# Patient Record
Sex: Female | Born: 1985 | Race: Black or African American | Hispanic: No | Marital: Single | Smoking: Never smoker
Health system: Southern US, Community
[De-identification: ages and names within clinical notes are randomized; demographics above are authoritative.]

## PROBLEM LIST (undated history)

## (undated) DIAGNOSIS — G5600 Carpal tunnel syndrome, unspecified upper limb: Secondary | ICD-10-CM

## (undated) DIAGNOSIS — R7989 Other specified abnormal findings of blood chemistry: Secondary | ICD-10-CM

## (undated) HISTORY — PX: WISDOM TOOTH EXTRACTION: SHX21

## (undated) HISTORY — DX: Carpal tunnel syndrome, unspecified upper limb: G56.00

## (undated) HISTORY — DX: Other specified abnormal findings of blood chemistry: R79.89

---

## 2007-03-17 ENCOUNTER — Other Ambulatory Visit: Admission: RE | Admit: 2007-03-17 | Discharge: 2007-03-17 | Payer: Self-pay | Admitting: Obstetrics and Gynecology

## 2008-01-19 ENCOUNTER — Inpatient Hospital Stay (HOSPITAL_COMMUNITY): Admission: AD | Admit: 2008-01-19 | Discharge: 2008-01-19 | Payer: Self-pay | Admitting: Obstetrics and Gynecology

## 2008-03-11 ENCOUNTER — Inpatient Hospital Stay (HOSPITAL_COMMUNITY): Admission: AD | Admit: 2008-03-11 | Discharge: 2008-03-13 | Payer: Self-pay | Admitting: Family Medicine

## 2010-10-28 NOTE — H&P (Signed)
NAME:  Yvette Zuniga, CALL                ACCOUNT NO.:  [id number]   MEDICAL RECORD NO.:  [id number]          PATIENT TYPE:  INP   LOCATION:  9174                          FACILITY:  [facility]   PHYSICIAN:  Janine Limbo, M.D.DATE OF BIRTH:  10-28-1985   DATE OF ADMISSION:  03/11/2008  DATE OF DISCHARGE:                              HISTORY & PHYSICAL   Yvette Zuniga is a 25 year old single black female primigravida at 39-3/7  weeks for an [facility] of March 15, 2008, who presents in active labor.  She  reports mucusy brown discharge, no leakage of fluid, good fetal  movement.  She has been contracting irregularly most of the day.  She  denied any other complaints.  She has been followed by the CNM service  at [facility].   HISTORY:  Remarkable for:  1. PENICILLIN ALLERGY.  2. GBS negative.  3. History of migraines.   OBSTETRICAL HISTORY:  The patient is primigravida.   PRENATAL LABS:  Blood type is B positive, Rh antibody screen negative,  sickle cell negative, RPR nonreactive, rubella titer immune, Hepatitis  Surface Antigen negative, HIV nonreactive, cystic fibrosis negative.  Pap was within normal limits October of 2008, and GC and Chlamydia  cultures were negative at that time.  GBS is negative.   PAST MEDICAL HISTORY:  Patient reports an ALLERGY TO PENICILLIN WHICH  CAUSES HIVES.  Denies latex allergy or other sensitivities.  She reports  menarche at age 62, 28-day cycle, no abnormalities.  Reported a last  menstrual period of June 07, 2007, giving her an [facility] of March 15, 2008.  She had an ultrasound February 11th and size was equal to dates  and dating consistent with LMP, that was a 6-week ultrasound.  She  reports use of birth control pills in the past, MRSA and Yasmin.  Her  Pap smear that was in October of 2008 was at [facility].  She reports Hepatitis B vaccine, normal childhood illnesses, a history  of migraines.   SURGICAL HISTORY:  Remarkable for wisdom teeth  excision x4 2008.   GENETIC HISTORY:  Unremarkable.   FAMILY HISTORY:  Mom chronic hypertension.  Maternal grandmother chronic  hypertension, is deceased.  Sister has sickle cell trait.  Maternal aunt  diabetic, insulin-dependent.  Sister hypothyroidism.  Mom has had a  stroke.   SOCIAL HISTORY:  Patient is a single white female.  She reports [employer].  Father of the baby's name is Janit Pagan.  Patient has  bachelor's degree, full-time Teacher, English as a foreign language.  Father of the baby  received his GED, he is unemployed.  Denied alcohol, tobacco or illicit  drug use.  The father of the baby has been here today and seems involved  and supportive.   HISTORY OF PRESENT PREGNANCY:  Patient entered care February 23rd for  new OB interview.  She is approximately 8-6/[redacted] weeks gestation.  Her  height was 5 feet 3 inches, her pregravid weight was 173.  When she  returned for her new OB workup on March 5th her weight was 184.  Desired  CNM  care.  She did have a first trimester screen on March 23rd and was  within normal limits.  She had an anatomy ultrasound at 18-5/7 weeks,  single intrauterine pregnancy, size equal to dates, cervical length 3.2  cm, posterior placenta, anatomy was within normal limits.  She was  slightly disappointed that suggestive of female.  She had lost some  weight prior to that visit and was back up to the 184.  Father of baby  was present at that visit.  At 23 weeks,  patient was doing well without  complaints.  At approximately 27 weeks, patient had 1+ glucosuria,  Dextrostix was 141, she was scheduled for a 1-hour GTT that day but  deferred.  Hemoglobin A1c was drawn at that time and was 4.8.  She  returned at 27-4/7 weeks and had her Glucola, hemoglobin at that time  was 10.6.  One-hour GTT was within normal limits equal to 124.  She was  put on iron supplement at previous visit and was having some trouble  with that and she desired to continue with some dietary  management.  At  her 32-week visit, she felt faint, she had to sit on the floor,  Dextrostix was 98, she was a little diaphoretic, blood pressure was  slightly low 90s/40s, 88/58 standing, lying down was 74/44.  MD was  consulted and was recommended a neuro eval.  That is the end of her  [place] that is available at this time.  She denied any other  significant complications in the pregnancy leading up to her admission  today in active labor.   OBJECTIVE:  She was afebrile and vital signs stable on admission, fetal  heart rate 130s, moderate variability, intermittent mild variables with  some of her contractions, it was reactive.  Toco she had a couplet  pattern every 2-4 minutes, moderate contractions on palpation.   PHYSICAL EXAM:  GENERAL:  She had some labored breathing but very  controlled, she was alert and oriented x3, very pleasant in between her  contractions.  CARDIOVASCULAR:  Regular rate and rhythm without murmur.  LUNGS:  Clear to auscultation bilaterally.  ABDOMEN:  Soft, nontender and gravid.  CERVIX:  Was 5 cm, 90%, -2 with bulging membranes, it was soft.  EXTREMITIES:  She had 1+ pitting edema both lower extremities, upper  shins and pedal as well.   IMPRESSION:  1. Intrauterine pregnancy at 39-3/7 weeks.  2. Active labor.  3. Group beta streptococcus negative.   PLAN:  1. Admit to birthing suites with Dr. Stefano Gaul as attending physician.  2. Routine L and D orders.  3. Support p.r.n.  4. Consult MD p.r.n.  5. AROM p.r.n. augmentation.      [name], CNM      ______________________________  Janine Limbo, M.D.    CHS/MEDQ  D:  03/11/2008  T:  03/11/2008  Job:  [id number]

## 2011-03-13 LAB — CBC
HCT: 28.6 — ABNORMAL LOW
Hemoglobin: 9.7 — ABNORMAL LOW
MCV: 94.3
Platelets: 330
RDW: 13

## 2011-03-13 LAB — COMPREHENSIVE METABOLIC PANEL
Alkaline Phosphatase: 81
BUN: 4 — ABNORMAL LOW
Chloride: 104
Creatinine, Ser: 0.54
Glucose, Bld: 97
Potassium: 3.6
Total Bilirubin: 0.5
Total Protein: 5.4 — ABNORMAL LOW

## 2011-03-16 LAB — CBC
HCT: 31.2 — ABNORMAL LOW
Hemoglobin: 10.6 — ABNORMAL LOW
Hemoglobin: 12
MCV: 94.8
RBC: 3.83 — ABNORMAL LOW
WBC: 15.4 — ABNORMAL HIGH
WBC: 16.4 — ABNORMAL HIGH

## 2011-03-16 LAB — RPR: RPR Ser Ql: NONREACTIVE

## 2011-08-20 ENCOUNTER — Encounter (INDEPENDENT_AMBULATORY_CARE_PROVIDER_SITE_OTHER): Payer: 59 | Admitting: Obstetrics and Gynecology

## 2011-08-20 DIAGNOSIS — N870 Mild cervical dysplasia: Secondary | ICD-10-CM

## 2012-06-28 ENCOUNTER — Other Ambulatory Visit: Payer: Self-pay

## 2012-06-28 MED ORDER — ETONOGESTREL-ETHINYL ESTRADIOL 0.12-0.015 MG/24HR VA RING: VAGINAL_RING | VAGINAL | Status: DC

## 2012-06-28 NOTE — Telephone Encounter (Signed)
Pt left msg on rx refill line requesting refill for Nuvaring. Last aex was 07/07/11, sent 1 nuvaring to [facility] [address] per pt request with 0 refills. Aex scheduled for 07/18/12 with AR.

## 2012-07-18 ENCOUNTER — Encounter: Payer: Self-pay | Admitting: Obstetrics and Gynecology

## 2012-07-18 ENCOUNTER — Ambulatory Visit: Payer: 59 | Admitting: Obstetrics and Gynecology

## 2012-07-18 VITALS — BP 102/60 | Resp 16 | Ht 64.25 in | Wt 200.0 lb

## 2012-07-18 DIAGNOSIS — IMO0001 Reserved for inherently not codable concepts without codable children: Secondary | ICD-10-CM

## 2012-07-18 DIAGNOSIS — Z01419 Encounter for gynecological examination (general) (routine) without abnormal findings: Secondary | ICD-10-CM

## 2012-07-18 DIAGNOSIS — Z124 Encounter for screening for malignant neoplasm of cervix: Secondary | ICD-10-CM

## 2012-07-18 MED ORDER — ETONOGESTREL-ETHINYL ESTRADIOL 0.12-0.015 MG/24HR VA RING: VAGINAL_RING | VAGINAL | Status: DC

## 2012-07-18 NOTE — Addendum Note (Signed)
Addended by: Osborn Coho on: 07/18/2012 11:36 AM   Modules accepted: Orders

## 2012-07-18 NOTE — Progress Notes (Signed)
Patient ID: Estefania Kamiya, female   DOB: [birth year], 27 y.o.   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 Last Mammo never Last Colonoscopy never Last Dexa Scan never Primary MD none Abuse at Home no  No complaints and wants to cont Nuvaring  Filed Vitals:   07/18/12 1048  BP: 102/60  Resp: 16   ROS: noncontributory  Physical Examination: General appearance - alert, well appearing, and in no distress Neck - supple, no significant adenopathy Chest - clear to auscultation, no wheezes, rales or rhonchi, symmetric air entry Heart - normal rate and regular rhythm Abdomen - soft, nontender, nondistended, no masses or organomegaly Breasts - breasts appear normal, no suspicious masses, no skin or nipple changes or axillary nodes Pelvic - normal external genitalia, vulva, vagina, cervix, uterus and adnexa Back exam - no CVAT Extremities - no edema, redness or tenderness in the calves or thighs  A/P Pap today 65yr for AEX Samples Nuvaring and Rx sent

## 2012-07-19 LAB — PAP IG W/ RFLX HPV ASCU

## 2012-07-20 LAB — HUMAN PAPILLOMAVIRUS, HIGH RISK: HPV DNA High Risk: NOT DETECTED

## 2012-07-22 ENCOUNTER — Encounter: Payer: Self-pay | Admitting: Obstetrics and Gynecology

## 2014-08-29 ENCOUNTER — Encounter: Payer: Self-pay | Admitting: *Deleted

## 2014-08-29 ENCOUNTER — Telehealth: Payer: Self-pay | Admitting: *Deleted

## 2014-08-29 NOTE — Telephone Encounter (Signed)
Pre-Visit Call completed with patient and chart updated.   Pre-Visit Info documented in Specialty Comments under Snap[employer]ot.    

## 2014-08-30 ENCOUNTER — Encounter: Payer: Self-pay | Admitting: Medical

## 2014-08-30 ENCOUNTER — Ambulatory Visit (INDEPENDENT_AMBULATORY_CARE_PROVIDER_SITE_OTHER): Payer: 59 | Admitting: Medical

## 2014-08-30 VITALS — BP 113/76 | HR 91 | Temp 98.4°F | Ht 64.25 in | Wt 215.8 lb

## 2014-08-30 DIAGNOSIS — D75839 Thrombocytosis, unspecified: Secondary | ICD-10-CM | POA: Insufficient documentation

## 2014-08-30 DIAGNOSIS — D473 Essential (hemorrhagic) thrombocythemia: Secondary | ICD-10-CM | POA: Diagnosis not present

## 2014-08-30 DIAGNOSIS — R7989 Other specified abnormal findings of blood chemistry: Secondary | ICD-10-CM

## 2014-08-30 DIAGNOSIS — G5600 Carpal tunnel syndrome, unspecified upper limb: Secondary | ICD-10-CM | POA: Insufficient documentation

## 2014-08-30 HISTORY — DX: Carpal tunnel syndrome, unspecified upper limb: G56.00

## 2014-08-30 LAB — COMPREHENSIVE METABOLIC PANEL
ALBUMIN: 3.6 g/dL (ref 3.5–5.2)
ALT: 8 U/L (ref 0–35)
AST: 14 U/L (ref 0–37)
Alkaline Phosphatase: 55 U/L (ref 39–117)
BUN: 9 mg/dL (ref 6–23)
CO2: 25 meq/L (ref 19–32)
CREATININE: 0.9 mg/dL (ref 0.40–1.20)
Calcium: 8.8 mg/dL (ref 8.4–10.5)
Chloride: 105 mEq/L (ref 96–112)
GFR: 94.66 mL/min (ref >60.00)
Glucose, Bld: 79 mg/dL (ref 70–99)
Potassium: 3.8 mEq/L (ref 3.5–5.1)
Sodium: 136 mEq/L (ref 135–145)
TOTAL PROTEIN: 7.3 g/dL (ref 6.0–8.3)
Total Bilirubin: 0.4 mg/dL (ref 0.2–1.2)

## 2014-08-30 LAB — CBC WITH DIFFERENTIAL/PLATELET
BASOS ABS: 0 10*3/uL (ref 0.0–0.1)
Basophils Relative: 0.5 % (ref 0.0–3.0)
EOS PCT: 0.3 % (ref 0.0–5.0)
Eosinophils Absolute: 0 10*3/uL (ref 0.0–0.7)
HCT: 38 % (ref 36.0–46.0)
Hemoglobin: 13.1 g/dL (ref 12.0–15.0)
LYMPHS ABS: 2.6 10*3/uL (ref 0.7–4.0)
LYMPHS PCT: 25.2 % (ref 12.0–46.0)
MCHC: 34.4 g/dL (ref 30.0–36.0)
MCV: 89 fl (ref 78.0–100.0)
MONO ABS: 0.4 10*3/uL (ref 0.1–1.0)
Monocytes Relative: 4.4 % (ref 3.0–12.0)
NEUTROS ABS: 7.1 10*3/uL (ref 1.4–7.7)
Neutrophils Relative %: 69.6 % (ref 43.0–77.0)
Platelets: 394 10*3/uL (ref 150.0–400.0)
RBC: 4.27 Mil/uL (ref 3.87–5.11)
RDW: 12.8 % (ref 11.5–15.5)
WBC: 10.1 10*3/uL (ref 4.0–10.5)

## 2014-08-30 NOTE — Assessment & Plan Note (Signed)
Will get cbc today and recheck platlets then decide if possible referral needed.

## 2014-08-30 NOTE — Assessment & Plan Note (Addendum)
Pt on vitamin D. Will put future order to repeat after she has finished 50,000 unit supplemenation and started otc vit d.  Lab to be done in 2 months.

## 2014-08-30 NOTE — Patient Instructions (Signed)
Thrombocytosis Will get cbc today and recheck platlets then decide if possible referral needed.   Low serum vitamin D Pt on vitamin D. Will put future order to repeat after she has finished 50,000 unit supplemenation and started otc vit d.  Lab to be done in 2 months.    Follow up date to be determined post lab review. We will call you when results are in.

## 2014-08-30 NOTE — Progress Notes (Signed)
Pre visit review using our clinic review tool, if applicable. No additional management support is needed unless otherwise documented below in the visit note. 

## 2014-08-30 NOTE — Assessment & Plan Note (Signed)
Did not discuss with pt today but in future will recommend cock up splints and low dose ibuprofen. Will mention on follow up.

## 2014-08-30 NOTE — Progress Notes (Signed)
Subjective:    Patient ID: Yvette Zuniga, female    DOB: [birth year], 29 y.o.   MRN: [id number]  HPI   I have reviewed pt PMH, PSH, FH, Social History and Surgical History  No chronic medical problems in the past. Only mild increase platelets and low vitamin d. No surgeries in past other than wisdome teeth extraction.  Pt is Personnel officer, exercise rarely recently due to work, Caffeine- 1 soda a week, single. 1 child 72 yo  Pt has mild elevated platelets in past one time 2014 and another in 2015. Plt 2014 was 427. Plt 2016 feb was 438.  Pt ros was negtaive except at night fingers tingle at night sometimes. She types a lot at work.  LMP- January 20 th, 2016. Pt is on nuvaring. Hx of skipped cycles with this in the past. On this for 6 years.  No family history of clotting issues.       Review of Systems  Constitutional: Negative for fever, chills and fatigue.  Respiratory: Negative for cough, chest tightness, shortness of breath and wheezing.   Cardiovascular: Negative for chest pain and palpitations.  Gastrointestinal: Negative for nausea, abdominal pain, diarrhea, constipation, blood in stool and anal bleeding.  Musculoskeletal: Negative for back pain.  Neurological: Positive for headaches. Negative for dizziness and weakness.       Very rare faint ha. Resolves with motrin.  Occasional tingle to finger tips at night. Transient and no other associated symptoms.  Hematological: Negative for adenopathy. Does not bruise/bleed easily.  Psychiatric/Behavioral: Negative for behavioral problems, confusion, self-injury, dysphoric mood and decreased concentration.   Past Medical History  Diagnosis Date  . High platelet count     History   Social History  . Marital Status: Single    Spouse Name: N/A  . Number of Children: N/A  . Years of Education: N/A   Occupational History  . Not on file.   Social History Main Topics  . Smoking status: Never Smoker   . Smokeless  tobacco: Not on file  . Alcohol Use: Yes     Comment: 1 bottle of wine twice a month.  . Drug Use: No  . Sexual Activity: Yes   Other Topics Concern  . Not on file   Social History Narrative    Past Surgical History  Procedure Laterality Date  . Wisdom tooth extraction      Family History  Problem Relation Age of Onset  . Hypertension Mother   . Diabetes Mother   . Stroke Mother   . Glaucoma Father     Allergies  Allergen Reactions  . Penicillins     Current Outpatient Prescriptions on File Prior to Visit  Medication Sig Dispense Refill  . etonogestrel-ethinyl estradiol (NUVARING) 0.12-0.015 MG/24HR vaginal ring Insert vaginally and leave in place for 3 consecutive weeks, then remove for 1 week. 1 each 12  . Vitamin D, Ergocalciferol, (DRISDOL) 50000 UNITS CAPS capsule Take 50,000 Units by mouth every 7 (seven) days.     No current facility-administered medications on file prior to visit.    BP 113/76 mmHg  Pulse 91  Temp(Src) 98.4 F (36.9 C) (Oral)  Ht 5' 4.25" (1.632 m)  Wt 215 lb 12.8 oz (97.886 kg)  BMI 36.75 kg/m2  SpO2 100%  LMP 07/04/2014      Objective:   Physical Exam   General Mental Status- Alert. General Appearance- Not in acute distress.   Skin General: Color- Normal Color. Moisture- Normal Moisture.  Neck  Carotid Arteries- Normal color. Moisture- Normal Moisture. No carotid bruits. No JVD.  Chest and Lung Exam Auscultation: Breath Sounds:-Normal.  Cardiovascular Auscultation:Rythm- Regular. Murmurs & Other Heart Sounds:Auscultation of the heart reveals- No Murmurs.  Abdomen Inspection:-Inspeection Normal. Palpation/Percussion:Note:No mass. Palpation and Percussion of the abdomen reveal- Non Tender, Non Distended + BS, no rebound or guarding.    Neurologic Cranial Nerve exam:- CN III-XII intact(No nystagmus), symmetric smile. Drift Test:- No drift. Romberg Exam:- Negative.  Heal to Toe Gait exam:-Normal. Finger to Nose:-  Normal/Intact Strength:- 5/5 equal and symmetric strength both upper and lower extremities.  Upper ext-  Negative phalens signs bilaterally.     Assessment & Plan:

## 2014-12-18 ENCOUNTER — Telehealth: Payer: Self-pay | Admitting: *Deleted

## 2014-12-18 NOTE — Telephone Encounter (Signed)
Unable to reach patient at time of Pre-Visit Call.  Left message for patient to return call when available.    

## 2014-12-19 ENCOUNTER — Ambulatory Visit (INDEPENDENT_AMBULATORY_CARE_PROVIDER_SITE_OTHER): Payer: 59 | Admitting: Medical

## 2014-12-19 ENCOUNTER — Encounter: Payer: Self-pay | Admitting: Medical

## 2014-12-19 VITALS — BP 116/83 | HR 100 | Temp 98.5°F | Ht 64.25 in | Wt 229.2 lb

## 2014-12-19 DIAGNOSIS — N390 Urinary tract infection, site not specified: Secondary | ICD-10-CM

## 2014-12-19 DIAGNOSIS — Z0000 Encounter for general adult medical examination without abnormal findings: Secondary | ICD-10-CM | POA: Diagnosis not present

## 2014-12-19 DIAGNOSIS — R82998 Other abnormal findings in urine: Secondary | ICD-10-CM

## 2014-12-19 LAB — POCT URINALYSIS DIPSTICK
BILIRUBIN UA: NEGATIVE
Blood, UA: POSITIVE
Glucose, UA: NEGATIVE
KETONES UA: NEGATIVE
NITRITE UA: NEGATIVE
PH UA: 5
PROTEIN UA: NEGATIVE
Spec Grav, UA: 1.025
Urobilinogen, UA: 0.2

## 2014-12-19 LAB — LIPID PANEL
Cholesterol: 150 mg/dL (ref 0–200)
HDL: 55.3 mg/dL (ref >39.00)
LDL Cholesterol: 73 mg/dL (ref 0–99)
NONHDL: 94.7
Total CHOL/HDL Ratio: 3
Triglycerides: 107 mg/dL (ref 0.0–149.0)
VLDL: 21.4 mg/dL (ref 0.0–40.0)

## 2014-12-19 LAB — COMPREHENSIVE METABOLIC PANEL
ALBUMIN: 3.5 g/dL (ref 3.5–5.2)
ALT: 15 U/L (ref 0–35)
AST: 15 U/L (ref 0–37)
Alkaline Phosphatase: 58 U/L (ref 39–117)
BUN: 12 mg/dL (ref 6–23)
CALCIUM: 9.1 mg/dL (ref 8.4–10.5)
CHLORIDE: 105 meq/L (ref 96–112)
CO2: 22 meq/L (ref 19–32)
CREATININE: 0.81 mg/dL (ref 0.40–1.20)
GFR: 106.68 mL/min (ref >60.00)
Glucose, Bld: 75 mg/dL (ref 70–99)
POTASSIUM: 3.6 meq/L (ref 3.5–5.1)
Sodium: 137 mEq/L (ref 135–145)
Total Bilirubin: 0.4 mg/dL (ref 0.2–1.2)
Total Protein: 7.7 g/dL (ref 6.0–8.3)

## 2014-12-19 LAB — TSH: TSH: 2.56 u[IU]/mL (ref 0.35–4.50)

## 2014-12-19 NOTE — Progress Notes (Signed)
Subjective:    Patient ID: Yvette Zuniga, female    DOB: Oct 02, 1985, 29 y.o.   MRN: [id number]  HPI   Pt in for a physical/wellness exam.  Pt has not been exercising, Caffeine 1 soda week, Diet eats some fruits and vegetables. Protein is chicken. Not a lot of red meat. Single-1 child.  Pt up to date on her tdap. Due in 2018.  Pt reminded to get fluvaccine this year towards the fall.  Pt recently had pap smear with gyn and breast exam. Both negative in February.  Pt taking a vacation this summer(September)    Review of Systems  Constitutional: Positive for fatigue. Negative for fever, chills, diaphoresis and activity change.       Fatigue worse recently than in the spring.  Respiratory: Negative for cough, chest tightness and shortness of breath.   Cardiovascular: Negative for chest pain, palpitations and leg swelling.  Gastrointestinal: Negative for nausea, vomiting and abdominal pain.  Musculoskeletal: Negative for neck pain and neck stiffness.  Neurological: Negative for dizziness, tremors, seizures, syncope, facial asymmetry, speech difficulty, weakness, light-headedness, numbness and headaches.  Hematological: Negative for adenopathy. Does not bruise/bleed easily.  Psychiatric/Behavioral: Negative for behavioral problems, confusion and agitation. The patient is not nervous/anxious.     Past Medical History  Diagnosis Date  . High platelet count   . CTS (carpal tunnel syndrome) 08/30/2014    History   Social History  . Marital Status: Single    Spouse Name: N/A  . Number of Children: N/A  . Years of Education: N/A   Occupational History  . Not on file.   Social History Main Topics  . Smoking status: Never Smoker   . Smokeless tobacco: Not on file  . Alcohol Use: Yes     Comment: 1 bottle of wine twice a month.  . Drug Use: No  . Sexual Activity: Yes   Other Topics Concern  . Not on file   Social History Narrative    Past Surgical History  Procedure  Laterality Date  . Wisdom tooth extraction      Family History  Problem Relation Age of Onset  . Hypertension Mother   . Diabetes Mother   . Stroke Mother   . Glaucoma Father     Allergies  Allergen Reactions  . Penicillins     Current Outpatient Prescriptions on File Prior to Visit  Medication Sig Dispense Refill  . etonogestrel-ethinyl estradiol (NUVARING) 0.12-0.015 MG/24HR vaginal ring Insert vaginally and leave in place for 3 consecutive weeks, then remove for 1 week. 1 each 12  . Vitamin D, Ergocalciferol, (DRISDOL) 50000 UNITS CAPS capsule Take 50,000 Units by mouth every 7 (seven) days.     No current facility-administered medications on file prior to visit.    BP 116/83 mmHg  Pulse 100  Temp(Src) 98.5 F (36.9 C) (Oral)  Ht 5' 4.25" (1.632 m)  Wt 229 lb 3.2 oz (103.964 kg)  BMI 39.03 kg/m2  SpO2 96%  LMP 12/10/2014       Objective:   Physical Exam  General Mental Status- Alert. General Appearance- Not in acute distress.   Skin General: Color- Normal Color. Moisture- Normal Moisture.  Neck Carotid Arteries- Normal color. Moisture- Normal Moisture. No carotid bruits. No JVD.  Chest and Lung Exam Auscultation: Breath Sounds:-Normal. CTA.  Cardiovascular Auscultation:Rythm- Regular, Rate and Rythm Murmurs & Other Heart Sounds:Auscultation of the heart reveals- No Murmurs.  Abdomen Inspection:-Inspeection Normal. Palpation/Percussion:Note:No mass. Palpation and Percussion of the abdomen reveal-  Non Tender, Non Distended + BS, no rebound or guarding.    Neurologic Cranial Nerve exam:- CN III-XII intact(No nystagmus), symmetric smile. Strength:- 5/5 equal and symmetric strength both upper and lower extremities.      Assessment & Plan:

## 2014-12-19 NOTE — Addendum Note (Signed)
Addended by: Bunnie Domino on: 12/19/2014 10:16 AM   Modules accepted: Orders

## 2014-12-19 NOTE — Progress Notes (Signed)
Pre visit review using our clinic review tool, if applicable. No additional management support is needed unless otherwise documented below in the visit note. 

## 2014-12-19 NOTE — Assessment & Plan Note (Signed)
Get cbc, cmp, tsh, lipid panel and ua today. Pt up to date on tetanus. Will get fluvaccine in early fall.(pt advised).

## 2014-12-19 NOTE — Patient Instructions (Addendum)
Wellness examination Get cbc, cmp, tsh, lipid panel and ua today. Pt up to date on tetanus. Will get fluvaccine in early fall.(pt advised).      Recommend daily exercise and healthy diet. Labs do work up fatigue.  Follow up year for wellness. But for fatigue date to be determined after lab review.   Preventive Care for Adults A healthy lifestyle and preventive care can promote health and wellness. Preventive health guidelines for women include the following key practices.  A routine yearly physical is a good way to check with your health care provider about your health and preventive screening. It is a chance to share any concerns and updates on your health and to receive a thorough exam.  Visit your dentist for a routine exam and preventive care every 6 months. Brush your teeth twice a day and floss once a day. Good oral hygiene prevents tooth decay and gum disease.  The frequency of eye exams is based on your age, health, family medical history, use of contact lenses, and other factors. Follow your health care provider's recommendations for frequency of eye exams.  Eat a healthy diet. Foods like vegetables, fruits, whole grains, low-fat dairy products, and lean protein foods contain the nutrients you need without too many calories. Decrease your intake of foods high in solid fats, added sugars, and salt. Eat the right amount of calories for you.Get information about a proper diet from your health care provider, if necessary.  Regular physical exercise is one of the most important things you can do for your health. Most adults should get at least 150 minutes of moderate-intensity exercise (any activity that increases your heart rate and causes you to sweat) each week. In addition, most adults need muscle-strengthening exercises on 2 or more days a week.  Maintain a healthy weight. The body mass index (BMI) is a screening tool to identify possible weight problems. It provides an estimate of  body fat based on height and weight. Your health care provider can find your BMI and can help you achieve or maintain a healthy weight.For adults 20 years and older:  A BMI below 18.5 is considered underweight.  A BMI of 18.5 to 24.9 is normal.  A BMI of 25 to 29.9 is considered overweight.  A BMI of 30 and above is considered obese.  Maintain normal blood lipids and cholesterol levels by exercising and minimizing your intake of saturated fat. Eat a balanced diet with plenty of fruit and vegetables. Blood tests for lipids and cholesterol should begin at age 13 and be repeated every 5 years. If your lipid or cholesterol levels are high, you are over 50, or you are at high risk for heart disease, you may need your cholesterol levels checked more frequently.Ongoing high lipid and cholesterol levels should be treated with medicines if diet and exercise are not working.  If you smoke, find out from your health care provider how to quit. If you do not use tobacco, do not start.  Lung cancer screening is recommended for adults aged 10-80 years who are at high risk for developing lung cancer because of a history of smoking. A yearly low-dose CT scan of the lungs is recommended for people who have at least a 30-pack-year history of smoking and are a current smoker or have quit within the past 15 years. A pack year of smoking is smoking an average of 1 pack of cigarettes a day for 1 year (for example: 1 pack a day for 30  years or 2 packs a day for 15 years). Yearly screening should continue until the smoker has stopped smoking for at least 15 years. Yearly screening should be stopped for people who develop a health problem that would prevent them from having lung cancer treatment.  If you are pregnant, do not drink alcohol. If you are breastfeeding, be very cautious about drinking alcohol. If you are not pregnant and choose to drink alcohol, do not have more than 1 drink per day. One drink is considered to  be 12 ounces (355 mL) of beer, 5 ounces (148 mL) of wine, or 1.5 ounces (44 mL) of liquor.  Avoid use of street drugs. Do not share needles with anyone. Ask for help if you need support or instructions about stopping the use of drugs.  High blood pressure causes heart disease and increases the risk of stroke. Your blood pressure should be checked at least every 1 to 2 years. Ongoing high blood pressure should be treated with medicines if weight loss and exercise do not work.  If you are 45-78 years old, ask your health care provider if you should take aspirin to prevent strokes.  Diabetes screening involves taking a blood sample to check your fasting blood sugar level. This should be done once every 3 years, after age 13, if you are within normal weight and without risk factors for diabetes. Testing should be considered at a younger age or be carried out more frequently if you are overweight and have at least 1 risk factor for diabetes.  Breast cancer screening is essential preventive care for women. You should practice "breast self-awareness." This means understanding the normal appearance and feel of your breasts and may include breast self-examination. Any changes detected, no matter how small, should be reported to a health care provider. Women in their 48s and 30s should have a clinical breast exam (CBE) by a health care provider as part of a regular health exam every 1 to 3 years. After age 11, women should have a CBE every year. Starting at age 71, women should consider having a mammogram (breast X-ray test) every year. Women who have a family history of breast cancer should talk to their health care provider about genetic screening. Women at a high risk of breast cancer should talk to their health care providers about having an MRI and a mammogram every year.  Breast cancer gene (BRCA)-related cancer risk assessment is recommended for women who have family members with BRCA-related cancers.  BRCA-related cancers include breast, ovarian, tubal, and peritoneal cancers. Having family members with these cancers may be associated with an increased risk for harmful changes (mutations) in the breast cancer genes BRCA1 and BRCA2. Results of the assessment will determine the need for genetic counseling and BRCA1 and BRCA2 testing.  Routine pelvic exams to screen for cancer are no longer recommended for nonpregnant women who are considered low risk for cancer of the pelvic organs (ovaries, uterus, and vagina) and who do not have symptoms. Ask your health care provider if a screening pelvic exam is right for you.  If you have had past treatment for cervical cancer or a condition that could lead to cancer, you need Pap tests and screening for cancer for at least 20 years after your treatment. If Pap tests have been discontinued, your risk factors (such as having a new sexual partner) need to be reassessed to determine if screening should be resumed. Some women have medical problems that increase the chance of getting  cervical cancer. In these cases, your health care provider may recommend more frequent screening and Pap tests.  The HPV test is an additional test that may be used for cervical cancer screening. The HPV test looks for the virus that can cause the cell changes on the cervix. The cells collected during the Pap test can be tested for HPV. The HPV test could be used to screen women aged 49 years and older, and should be used in women of any age who have unclear Pap test results. After the age of 46, women should have HPV testing at the same frequency as a Pap test.  Colorectal cancer can be detected and often prevented. Most routine colorectal cancer screening begins at the age of 35 years and continues through age 47 years. However, your health care provider may recommend screening at an earlier age if you have risk factors for colon cancer. On a yearly basis, your health care provider may  provide home test kits to check for hidden blood in the stool. Use of a small camera at the end of a tube, to directly examine the colon (sigmoidoscopy or colonoscopy), can detect the earliest forms of colorectal cancer. Talk to your health care provider about this at age 51, when routine screening begins. Direct exam of the colon should be repeated every 5-10 years through age 68 years, unless early forms of pre-cancerous polyps or small growths are found.  People who are at an increased risk for hepatitis B should be screened for this virus. You are considered at high risk for hepatitis B if:  You were born in a country where hepatitis B occurs often. Talk with your health care provider about which countries are considered high risk.  Your parents were born in a high-risk country and you have not received a shot to protect against hepatitis B (hepatitis B vaccine).  You have HIV or AIDS.  You use needles to inject street drugs.  You live with, or have sex with, someone who has hepatitis B.  You get hemodialysis treatment.  You take certain medicines for conditions like cancer, organ transplantation, and autoimmune conditions.  Hepatitis C blood testing is recommended for all people born from [birth year] through 1966 and any individual with known risks for hepatitis C.  Practice safe sex. Use condoms and avoid high-risk sexual practices to reduce the spread of sexually transmitted infections (STIs). STIs include gonorrhea, chlamydia, syphilis, trichomonas, herpes, HPV, and human immunodeficiency virus (HIV). Herpes, HIV, and HPV are viral illnesses that have no cure. They can result in disability, cancer, and death.  You should be screened for sexually transmitted illnesses (STIs) including gonorrhea and chlamydia if:  You are sexually active and are younger than 24 years.  You are older than 24 years and your health care provider tells you that you are at risk for this type of  infection.  Your sexual activity has changed since you were last screened and you are at an increased risk for chlamydia or gonorrhea. Ask your health care provider if you are at risk.  If you are at risk of being infected with HIV, it is recommended that you take a prescription medicine daily to prevent HIV infection. This is called preexposure prophylaxis (PrEP). You are considered at risk if:  You are a heterosexual woman, are sexually active, and are at increased risk for HIV infection.  You take drugs by injection.  You are sexually active with a partner who has HIV.  Talk with  your health care provider about whether you are at high risk of being infected with HIV. If you choose to begin PrEP, you should first be tested for HIV. You should then be tested every 3 months for as long as you are taking PrEP.  Osteoporosis is a disease in which the bones lose minerals and strength with aging. This can result in serious bone fractures or breaks. The risk of osteoporosis can be identified using a bone density scan. Women ages 87 years and over and women at risk for fractures or osteoporosis should discuss screening with their health care providers. Ask your health care provider whether you should take a calcium supplement or vitamin D to reduce the rate of osteoporosis.  Menopause can be associated with physical symptoms and risks. Hormone replacement therapy is available to decrease symptoms and risks. You should talk to your health care provider about whether hormone replacement therapy is right for you.  Use sunscreen. Apply sunscreen liberally and repeatedly throughout the day. You should seek shade when your shadow is shorter than you. Protect yourself by wearing long sleeves, pants, a wide-brimmed hat, and sunglasses year round, whenever you are outdoors.  Once a month, do a whole body skin exam, using a mirror to look at the skin on your back. Tell your health care provider of new moles,  moles that have irregular borders, moles that are larger than a pencil eraser, or moles that have changed in shape or color.  Stay current with required vaccines (immunizations).  Influenza vaccine. All adults should be immunized every year.  Tetanus, diphtheria, and acellular pertussis (Td, Tdap) vaccine. Pregnant women should receive 1 dose of Tdap vaccine during each pregnancy. The dose should be obtained regardless of the length of time since the last dose. Immunization is preferred during the 27th-36th week of gestation. An adult who has not previously received Tdap or who does not know her vaccine status should receive 1 dose of Tdap. This initial dose should be followed by tetanus and diphtheria toxoids (Td) booster doses every 10 years. Adults with an unknown or incomplete history of completing a 3-dose immunization series with Td-containing vaccines should begin or complete a primary immunization series including a Tdap dose. Adults should receive a Td booster every 10 years.  Varicella vaccine. An adult without evidence of immunity to varicella should receive 2 doses or a second dose if she has previously received 1 dose. Pregnant females who do not have evidence of immunity should receive the first dose after pregnancy. This first dose should be obtained before leaving the health care facility. The second dose should be obtained 4-8 weeks after the first dose.  Human papillomavirus (HPV) vaccine. Females aged 13-26 years who have not received the vaccine previously should obtain the 3-dose series. The vaccine is not recommended for use in pregnant females. However, pregnancy testing is not needed before receiving a dose. If a female is found to be pregnant after receiving a dose, no treatment is needed. In that case, the remaining doses should be delayed until after the pregnancy. Immunization is recommended for any person with an immunocompromised condition through the age of 74 years if she  did not get any or all doses earlier. During the 3-dose series, the second dose should be obtained 4-8 weeks after the first dose. The third dose should be obtained 24 weeks after the first dose and 16 weeks after the second dose.  Zoster vaccine. One dose is recommended for adults aged 2  years or older unless certain conditions are present.  Measles, mumps, and rubella (MMR) vaccine. Adults born before [birth year] generally are considered immune to measles and mumps. Adults born in [birth year] or later should have 1 or more doses of MMR vaccine unless there is a contraindication to the vaccine or there is laboratory evidence of immunity to each of the three diseases. A routine second dose of MMR vaccine should be obtained at least 28 days after the first dose for students attending postsecondary schools, health care workers, or international travelers. People who received inactivated measles vaccine or an unknown type of measles vaccine during 1963-1967 should receive 2 doses of MMR vaccine. People who received inactivated mumps vaccine or an unknown type of mumps vaccine before 1979 and are at high risk for mumps infection should consider immunization with 2 doses of MMR vaccine. For females of childbearing age, rubella immunity should be determined. If there is no evidence of immunity, females who are not pregnant should be vaccinated. If there is no evidence of immunity, females who are pregnant should delay immunization until after pregnancy. Unvaccinated health care workers born before [birth year] who lack laboratory evidence of measles, mumps, or rubella immunity or laboratory confirmation of disease should consider measles and mumps immunization with 2 doses of MMR vaccine or rubella immunization with 1 dose of MMR vaccine.  Pneumococcal 13-valent conjugate (PCV13) vaccine. When indicated, a person who is uncertain of her immunization history and has no record of immunization should receive the PCV13 vaccine. An adult  aged 81 years or older who has certain medical conditions and has not been previously immunized should receive 1 dose of PCV13 vaccine. This PCV13 should be followed with a dose of pneumococcal polysaccharide (PPSV23) vaccine. The PPSV23 vaccine dose should be obtained at least 8 weeks after the dose of PCV13 vaccine. An adult aged 33 years or older who has certain medical conditions and previously received 1 or more doses of PPSV23 vaccine should receive 1 dose of PCV13. The PCV13 vaccine dose should be obtained 1 or more years after the last PPSV23 vaccine dose.  Pneumococcal polysaccharide (PPSV23) vaccine. When PCV13 is also indicated, PCV13 should be obtained first. All adults aged 2 years and older should be immunized. An adult younger than age 65 years who has certain medical conditions should be immunized. Any person who resides in a nursing home or long-term care facility should be immunized. An adult smoker should be immunized. People with an immunocompromised condition and certain other conditions should receive both PCV13 and PPSV23 vaccines. People with human immunodeficiency virus (HIV) infection should be immunized as soon as possible after diagnosis. Immunization during chemotherapy or radiation therapy should be avoided. Routine use of PPSV23 vaccine is not recommended for American Indians, [place] Natives, or people younger than 65 years unless there are medical conditions that require PPSV23 vaccine. When indicated, people who have unknown immunization and have no record of immunization should receive PPSV23 vaccine. One-time revaccination 5 years after the first dose of PPSV23 is recommended for people aged 19-64 years who have chronic kidney failure, nephrotic syndrome, asplenia, or immunocompromised conditions. People who received 1-2 doses of PPSV23 before age 73 years should receive another dose of PPSV23 vaccine at age 44 years or later if at least 5 years have passed since the previous  dose. Doses of PPSV23 are not needed for people immunized with PPSV23 at or after age 15 years.  Meningococcal vaccine. Adults with asplenia or persistent complement component deficiencies should  receive 2 doses of quadrivalent meningococcal conjugate (MenACWY-D) vaccine. The doses should be obtained at least 2 months apart. Microbiologists working with certain meningococcal bacteria, [employer] recruits, people at risk during an outbreak, and people who travel to or live in countries with a high rate of meningitis should be immunized. A first-year college student up through age 11 years who is living in a residence hall should receive a dose if she did not receive a dose on or after her 16th birthday. Adults who have certain high-risk conditions should receive one or more doses of vaccine.  Hepatitis A vaccine. Adults who wish to be protected from this disease, have certain high-risk conditions, work with hepatitis A-infected animals, work in hepatitis A research labs, or travel to or work in countries with a high rate of hepatitis A should be immunized. Adults who were previously unvaccinated and who anticipate close contact with an international adoptee during the first 60 days after arrival in the [place] from a country with a high rate of hepatitis A should be immunized.  Hepatitis B vaccine. Adults who wish to be protected from this disease, have certain high-risk conditions, may be exposed to blood or other infectious body fluids, are household contacts or sex partners of hepatitis B positive people, are clients or workers in certain care facilities, or travel to or work in countries with a high rate of hepatitis B should be immunized.  Haemophilus influenzae type b (Hib) vaccine. A previously unvaccinated person with asplenia or sickle cell disease or having a scheduled splenectomy should receive 1 dose of Hib vaccine. Regardless of previous immunization, a recipient of a hematopoietic stem cell  transplant should receive a 3-dose series 6-12 months after her successful transplant. Hib vaccine is not recommended for adults with HIV infection. Preventive Services / Frequency Ages 16 to 10 years  Blood pressure check.** / Every 1 to 2 years.  Lipid and cholesterol check.** / Every 5 years beginning at age 69.  Clinical breast exam.** / Every 3 years for women in their 38s and 61s.  BRCA-related cancer risk assessment.** / For women who have family members with a BRCA-related cancer (breast, ovarian, tubal, or peritoneal cancers).  Pap test.** / Every 2 years from ages 20 through 22. Every 3 years starting at age 109 through age 78 or 22 with a history of 3 consecutive normal Pap tests.  HPV screening.** / Every 3 years from ages 53 through ages 73 to 38 with a history of 3 consecutive normal Pap tests.  Hepatitis C blood test.** / For any individual with known risks for hepatitis C.  Skin self-exam. / Monthly.  Influenza vaccine. / Every year.  Tetanus, diphtheria, and acellular pertussis (Tdap, Td) vaccine.** / Consult your health care provider. Pregnant women should receive 1 dose of Tdap vaccine during each pregnancy. 1 dose of Td every 10 years.  Varicella vaccine.** / Consult your health care provider. Pregnant females who do not have evidence of immunity should receive the first dose after pregnancy.  HPV vaccine. / 3 doses over 6 months, if 57 and younger. The vaccine is not recommended for use in pregnant females. However, pregnancy testing is not needed before receiving a dose.  Measles, mumps, rubella (MMR) vaccine.** / You need at least 1 dose of MMR if you were born in 1957 or later. You may also need a 2nd dose. For females of childbearing age, rubella immunity should be determined. If there is no evidence of immunity, females who  are not pregnant should be vaccinated. If there is no evidence of immunity, females who are pregnant should delay immunization until after  pregnancy.  Pneumococcal 13-valent conjugate (PCV13) vaccine.** / Consult your health care provider.  Pneumococcal polysaccharide (PPSV23) vaccine.** / 1 to 2 doses if you smoke cigarettes or if you have certain conditions.  Meningococcal vaccine.** / 1 dose if you are age 77 to 39 years and a Market researcher living in a residence hall, or have one of several medical conditions, you need to get vaccinated against meningococcal disease. You may also need additional booster doses.  Hepatitis A vaccine.** / Consult your health care provider.  Hepatitis B vaccine.** / Consult your health care provider.  Haemophilus influenzae type b (Hib) vaccine.** / Consult your health care provider. Ages 2 to 29 years  Blood pressure check.** / Every 1 to 2 years.  Lipid and cholesterol check.** / Every 5 years beginning at age 46 years.  Lung cancer screening. / Every year if you are aged 62-80 years and have a 30-pack-year history of smoking and currently smoke or have quit within the past 15 years. Yearly screening is stopped once you have quit smoking for at least 15 years or develop a health problem that would prevent you from having lung cancer treatment.  Clinical breast exam.** / Every year after age 50 years.  BRCA-related cancer risk assessment.** / For women who have family members with a BRCA-related cancer (breast, ovarian, tubal, or peritoneal cancers).  Mammogram.** / Every year beginning at age 31 years and continuing for as long as you are in good health. Consult with your health care provider.  Pap test.** / Every 3 years starting at age 71 years through age 72 or 61 years with a history of 3 consecutive normal Pap tests.  HPV screening.** / Every 3 years from ages 16 years through ages 79 to 61 years with a history of 3 consecutive normal Pap tests.  Fecal occult blood test (FOBT) of stool. / Every year beginning at age 52 years and continuing until age 103 years. You may  not need to do this test if you get a colonoscopy every 10 years.  Flexible sigmoidoscopy or colonoscopy.** / Every 5 years for a flexible sigmoidoscopy or every 10 years for a colonoscopy beginning at age 37 years and continuing until age 46 years.  Hepatitis C blood test.** / For all people born from [birth year] through 1966 and any individual with known risks for hepatitis C.  Skin self-exam. / Monthly.  Influenza vaccine. / Every year.  Tetanus, diphtheria, and acellular pertussis (Tdap/Td) vaccine.** / Consult your health care provider. Pregnant women should receive 1 dose of Tdap vaccine during each pregnancy. 1 dose of Td every 10 years.  Varicella vaccine.** / Consult your health care provider. Pregnant females who do not have evidence of immunity should receive the first dose after pregnancy.  Zoster vaccine.** / 1 dose for adults aged 54 years or older.  Measles, mumps, rubella (MMR) vaccine.** / You need at least 1 dose of MMR if you were born in 1957 or later. You may also need a 2nd dose. For females of childbearing age, rubella immunity should be determined. If there is no evidence of immunity, females who are not pregnant should be vaccinated. If there is no evidence of immunity, females who are pregnant should delay immunization until after pregnancy.  Pneumococcal 13-valent conjugate (PCV13) vaccine.** / Consult your health care provider.  Pneumococcal polysaccharide (PPSV23) vaccine.** /  1 to 2 doses if you smoke cigarettes or if you have certain conditions.  Meningococcal vaccine.** / Consult your health care provider.  Hepatitis A vaccine.** / Consult your health care provider.  Hepatitis B vaccine.** / Consult your health care provider.  Haemophilus influenzae type b (Hib) vaccine.** / Consult your health care provider. Ages 62 years and over  Blood pressure check.** / Every 1 to 2 years.  Lipid and cholesterol check.** / Every 5 years beginning at age 29 years.  Lung  cancer screening. / Every year if you are aged 85-80 years and have a 30-pack-year history of smoking and currently smoke or have quit within the past 15 years. Yearly screening is stopped once you have quit smoking for at least 15 years or develop a health problem that would prevent you from having lung cancer treatment.  Clinical breast exam.** / Every year after age 70 years.  BRCA-related cancer risk assessment.** / For women who have family members with a BRCA-related cancer (breast, ovarian, tubal, or peritoneal cancers).  Mammogram.** / Every year beginning at age 17 years and continuing for as long as you are in good health. Consult with your health care provider.  Pap test.** / Every 3 years starting at age 75 years through age 72 or 44 years with 3 consecutive normal Pap tests. Testing can be stopped between 65 and 70 years with 3 consecutive normal Pap tests and no abnormal Pap or HPV tests in the past 10 years.  HPV screening.** / Every 3 years from ages 22 years through ages 67 or 17 years with a history of 3 consecutive normal Pap tests. Testing can be stopped between 65 and 70 years with 3 consecutive normal Pap tests and no abnormal Pap or HPV tests in the past 10 years.  Fecal occult blood test (FOBT) of stool. / Every year beginning at age 26 years and continuing until age 78 years. You may not need to do this test if you get a colonoscopy every 10 years.  Flexible sigmoidoscopy or colonoscopy.** / Every 5 years for a flexible sigmoidoscopy or every 10 years for a colonoscopy beginning at age 30 years and continuing until age 66 years.  Hepatitis C blood test.** / For all people born from [birth year] through 1966 and any individual with known risks for hepatitis C.  Osteoporosis screening.** / A one-time screening for women ages 86 years and over and women at risk for fractures or osteoporosis.  Skin self-exam. / Monthly.  Influenza vaccine. / Every year.  Tetanus, diphtheria, and  acellular pertussis (Tdap/Td) vaccine.** / 1 dose of Td every 10 years.  Varicella vaccine.** / Consult your health care provider.  Zoster vaccine.** / 1 dose for adults aged 6 years or older.  Pneumococcal 13-valent conjugate (PCV13) vaccine.** / Consult your health care provider.  Pneumococcal polysaccharide (PPSV23) vaccine.** / 1 dose for all adults aged 53 years and older.  Meningococcal vaccine.** / Consult your health care provider.  Hepatitis A vaccine.** / Consult your health care provider.  Hepatitis B vaccine.** / Consult your health care provider.  Haemophilus influenzae type b (Hib) vaccine.** / Consult your health care provider. ** Family history and personal history of risk and conditions may change your health care provider's recommendations. Document Released: 07/28/2001 Document Revised: 10/16/2013 Document Reviewed: 10/27/2010 [facility] Patient Information 2015 [place], [place]. This information is not intended to replace advice given to you by your health care provider. Make sure you discuss any questions you have with  your health care provider.

## 2014-12-20 ENCOUNTER — Encounter: Payer: Self-pay | Admitting: Medical

## 2014-12-20 LAB — CBC WITH DIFFERENTIAL/PLATELET
BASOS PCT: 0.6 % (ref 0.0–3.0)
Basophils Absolute: 0.1 10*3/uL (ref 0.0–0.1)
EOS ABS: 0.1 10*3/uL (ref 0.0–0.7)
EOS PCT: 0.6 % (ref 0.0–5.0)
HCT: 42.4 % (ref 36.0–46.0)
Hemoglobin: 14.2 g/dL (ref 12.0–15.0)
LYMPHS PCT: 26.4 % (ref 12.0–46.0)
Lymphs Abs: 2.8 10*3/uL (ref 0.7–4.0)
MCHC: 33.4 g/dL (ref 30.0–36.0)
MCV: 91.3 fl (ref 78.0–100.0)
MONOS PCT: 1.8 % — AB (ref 3.0–12.0)
Monocytes Absolute: 0.2 10*3/uL (ref 0.1–1.0)
NEUTROS ABS: 7.6 10*3/uL (ref 1.4–7.7)
NEUTROS PCT: 70.6 % (ref 43.0–77.0)
Platelets: 408 10*3/uL — ABNORMAL HIGH (ref 150.0–400.0)
RBC: 4.65 Mil/uL (ref 3.87–5.11)
RDW: 12.7 % (ref 11.5–15.5)
WBC: 10.8 10*3/uL — AB (ref 4.0–10.5)

## 2014-12-20 LAB — URINE CULTURE
COLONY COUNT: NO GROWTH
Organism ID, Bacteria: NO GROWTH

## 2014-12-27 ENCOUNTER — Telehealth: Payer: Self-pay | Admitting: Medical

## 2014-12-27 ENCOUNTER — Ambulatory Visit (INDEPENDENT_AMBULATORY_CARE_PROVIDER_SITE_OTHER): Payer: 59 | Admitting: Medical

## 2014-12-27 ENCOUNTER — Encounter: Payer: Self-pay | Admitting: Medical

## 2014-12-27 VITALS — BP 119/79 | HR 100 | Temp 98.3°F | Ht 64.25 in | Wt 228.2 lb

## 2014-12-27 DIAGNOSIS — E669 Obesity, unspecified: Secondary | ICD-10-CM | POA: Diagnosis not present

## 2014-12-27 MED ORDER — LORCASERIN HCL 10 MG PO TABS: ORAL_TABLET | ORAL | Status: DC

## 2014-12-27 NOTE — Progress Notes (Signed)
   Subjective:    Patient ID: Yvette Zuniga, female    DOB: May 26, 1986, 29 y.o.   MRN: [id number]  HPI   Pt in today for follow up. She wants to be on phentermine. Pt pulse was high on the last 2 visits.  No palpitations. No chest pain.  Pt weight today is 228. Pt has been dieting and trying to loose weight for about one year now.  Pt blood pressure is good today.  Pt last menses was June 27 th.     Review of Systems  Constitutional: Negative for fever, chills and fatigue.  HENT: Negative for congestion.   Respiratory: Negative for cough, chest tightness, shortness of breath and wheezing.   Cardiovascular: Negative for chest pain and palpitations.  Musculoskeletal: Negative for back pain.  Hematological: Negative for adenopathy. Does not bruise/bleed easily.  Psychiatric/Behavioral: Negative for behavioral problems and confusion.     Past Medical History  Diagnosis Date  . High platelet count   . CTS (carpal tunnel syndrome) 08/30/2014    History   Social History  . Marital Status: Single    Spouse Name: N/A  . Number of Children: N/A  . Years of Education: N/A   Occupational History  . Not on file.   Social History Main Topics  . Smoking status: Never Smoker   . Smokeless tobacco: Not on file  . Alcohol Use: Yes     Comment: 1 bottle of wine twice a month.  . Drug Use: No  . Sexual Activity: Yes   Other Topics Concern  . Not on file   Social History Narrative    Past Surgical History  Procedure Laterality Date  . Wisdom tooth extraction      Family History  Problem Relation Age of Onset  . Hypertension Mother   . Diabetes Mother   . Stroke Mother   . Glaucoma Father     Allergies  Allergen Reactions  . Penicillins     Current Outpatient Prescriptions on File Prior to Visit  Medication Sig Dispense Refill  . etonogestrel-ethinyl estradiol (NUVARING) 0.12-0.015 MG/24HR vaginal ring Insert vaginally and leave in place for 3 consecutive  weeks, then remove for 1 week. 1 each 12   No current facility-administered medications on file prior to visit.    BP 119/79 mmHg  Pulse 100  Temp(Src) 98.3 F (36.8 C) (Oral)  Ht 5' 4.25" (1.632 m)  Wt 228 lb 3.2 oz (103.511 kg)  BMI 38.86 kg/m2  SpO2 95%  LMP 12/10/2014       Objective:   Physical Exam  General- No acute distress. Pleasant patient. Neck- Full range of motion, no jvd. No thromegaly. Lungs- Clear, even and unlabored. Heart- regular rate and rhythm. Neurologic- CNII- XII grossly intact.      Assessment & Plan:  Diet, exercise and start lorcaserin 10 mg a day.   Follow up in 2 wk to assess your weight loss progress

## 2014-12-27 NOTE — Progress Notes (Signed)
Pre visit review using our clinic review tool, if applicable. No additional management support is needed unless otherwise documented below in the visit note. 

## 2014-12-27 NOTE — Telephone Encounter (Signed)
Caller name: Misao Relation to pt: self  Call back number: [phone] Pharmacy: [id number] - [place], [place]  Reason for call: Pt called office stating was seen today and was prescribed Lorcaserin HCl 10 MG TABS, pt went to pharmacy to pick up meds and her insurance will not cover meds it will cost $139.99, pt is requesting if can prescribe other med that will be cover by insurance or can be cheaper. Please advise.

## 2014-12-27 NOTE — Patient Instructions (Addendum)
Diet, exercise and start lorcaserin 10 mg a day.   Follow up in 2 wk to assess your weight loss progress.  Note decided not to rx phentermine due to borderline mild tachycardia.

## 2014-12-28 ENCOUNTER — Encounter: Payer: Self-pay | Admitting: Medical

## 2014-12-28 NOTE — Telephone Encounter (Signed)
Trying to investigate of belvique coupon available since med too expensive for pt.

## 2014-12-28 NOTE — Telephone Encounter (Signed)
Pt med was too expensive for weight loss. Let pt know I need to investigate. See if maybe can find coupon that will give pt discount. Not a lot of options that are equivalent.

## 2015-01-02 ENCOUNTER — Encounter: Payer: Self-pay | Admitting: Medical

## 2015-01-09 ENCOUNTER — Encounter: Payer: Self-pay | Admitting: Medical

## 2015-01-10 ENCOUNTER — Ambulatory Visit: Payer: 59 | Admitting: Medical

## 2015-01-11 ENCOUNTER — Telehealth: Payer: Self-pay | Admitting: Medical

## 2015-01-11 NOTE — Telephone Encounter (Signed)
Relation to TK:PTWS  Call back number:[phone] Pharmacy: [id number] - [place], [place]  Reason for call:   Patient said it would be cheaper if RX is sent in for 60 tablets of Lorcaserin HCl 10 MG TABS.

## 2015-01-14 ENCOUNTER — Encounter: Payer: Self-pay | Admitting: Medical

## 2015-01-14 NOTE — Telephone Encounter (Signed)
I sent pt my chart message regarding her answer to prescribing #60 tabs of belvique

## 2015-01-15 MED ORDER — LORCASERIN HCL 10 MG PO TABS: ORAL_TABLET | ORAL | Status: DC

## 2015-01-15 NOTE — Telephone Encounter (Signed)
phamacist in our building. States lorcaserin can be taken twice a day. This is way I rx'd. Pt sent message she can pick up rx.

## 2015-01-31 ENCOUNTER — Ambulatory Visit (INDEPENDENT_AMBULATORY_CARE_PROVIDER_SITE_OTHER): Payer: 59 | Admitting: Medical

## 2015-01-31 ENCOUNTER — Encounter: Payer: Self-pay | Admitting: Medical

## 2015-01-31 VITALS — BP 131/79 | HR 91 | Temp 97.9°F | Ht 64.25 in | Wt 227.6 lb

## 2015-01-31 DIAGNOSIS — E669 Obesity, unspecified: Secondary | ICD-10-CM | POA: Diagnosis not present

## 2015-01-31 NOTE — Progress Notes (Signed)
Pre visit review using our clinic review tool, if applicable. No additional management support is needed unless otherwise documented below in the visit note. 

## 2015-01-31 NOTE — Progress Notes (Signed)
   Subjective:    Patient ID: Yvette Zuniga, female    DOB: [birth year], 29 y.o.   MRN: [id number]  HPI  Pt in for follow up for weight loss. Pt is on belvique twice a day. At first when took med felt  mild muscle cramps and mild ha for 4 days. Then that went away and has not come back. Now no side effects. Pt has been on med for 16 days. Pt does not have a scale at home. Pt is exercising twice a week for 20 minutes. She has been traveling for work. Pt has been tracking calories and taking in about 1400 calories a day.  lmp- January 07, 2015. Normal cycle.    Review of Systems  Constitutional: Negative for fever, chills and fatigue.  Respiratory: Negative for cough, chest tightness, shortness of breath and wheezing.   Cardiovascular: Negative for chest pain and palpitations.  Musculoskeletal: Negative for back pain and gait problem.  Neurological: Negative for dizziness, seizures, numbness and headaches.  Hematological: Negative for adenopathy. Does not bruise/bleed easily.  Psychiatric/Behavioral: Negative for behavioral problems and confusion.    Past Medical History  Diagnosis Date  . High platelet count   . CTS (carpal tunnel syndrome) 08/30/2014    Social History   Social History  . Marital Status: Single    Spouse Name: N/A  . Number of Children: N/A  . Years of Education: N/A   Occupational History  . Not on file.   Social History Main Topics  . Smoking status: Never Smoker   . Smokeless tobacco: Not on file  . Alcohol Use: Yes     Comment: 1 bottle of wine twice a month.  . Drug Use: No  . Sexual Activity: Yes   Other Topics Concern  . Not on file   Social History Narrative    Past Surgical History  Procedure Laterality Date  . Wisdom tooth extraction      Family History  Problem Relation Age of Onset  . Hypertension Mother   . Diabetes Mother   . Stroke Mother   . Glaucoma Father     Allergies  Allergen Reactions  . Penicillins     Current  Outpatient Prescriptions on File Prior to Visit  Medication Sig Dispense Refill  . etonogestrel-ethinyl estradiol (NUVARING) 0.12-0.015 MG/24HR vaginal ring Insert vaginally and leave in place for 3 consecutive weeks, then remove for 1 week. 1 each 12  . Lorcaserin HCl 10 MG TABS 1 tab po bid 60 tablet 0   No current facility-administered medications on file prior to visit.    BP 131/79 mmHg  Pulse 91  Temp(Src) 97.9 F (36.6 C) (Oral)  Ht 5' 4.25" (1.632 m)  Wt 227 lb 9.6 oz (103.239 kg)  BMI 38.76 kg/m2  SpO2 97%  LMP 01/07/2015       Objective:   Physical Exam  General- No acute distress. Pleasant patient. Neck- Full range of motion, no jvd Lungs- Clear, even and unlabored. Heart- regular rate and rhythm. Neurologic- CNII- XII grossly intact.       Assessment & Plan:  Continue diet, exercise and Lorcaserin tabs.  Advise 30 minutes exercise a day.  Weigh self daily in am. Notify us when need a refill.  Explained goal of weight loss at least 5% over 12 wk while on med.  Follow up in 6 wks.

## 2015-01-31 NOTE — Patient Instructions (Signed)
Continue diet, exercise and Lorcaserin tabs.  Advise 30 minutes exercise a day.  Weigh self daily in am. Notify us when need a refill.  Explained goal of weight loss at least 5% over 12 wk while on med.  Follow up in 6 wks.

## 2015-02-14 ENCOUNTER — Telehealth: Payer: Self-pay | Admitting: Medical

## 2015-02-15 MED ORDER — LORCASERIN HCL 10 MG PO TABS: ORAL_TABLET | ORAL | Status: DC

## 2015-02-15 NOTE — Telephone Encounter (Signed)
Pt can pick up locaserin rx when her current rx runs out.

## 2015-02-15 NOTE — Telephone Encounter (Signed)
Patient notified

## 2015-03-05 ENCOUNTER — Encounter: Payer: Self-pay | Admitting: Medical

## 2015-03-05 ENCOUNTER — Ambulatory Visit (INDEPENDENT_AMBULATORY_CARE_PROVIDER_SITE_OTHER): Payer: 59 | Admitting: Medical

## 2015-03-05 VITALS — BP 124/82 | HR 88 | Temp 97.5°F | Ht 64.25 in | Wt 222.8 lb

## 2015-03-05 DIAGNOSIS — E669 Obesity, unspecified: Secondary | ICD-10-CM

## 2015-03-05 MED ORDER — LORCASERIN HCL 10 MG PO TABS: ORAL_TABLET | ORAL | Status: DC

## 2015-03-05 NOTE — Patient Instructions (Addendum)
Pt has made good progress. Weight loss of at least 5 lbs. By scale at home 9 lbs. Will still shoot for at least 5% loss by 12 weeks use of lorcoserin.  Will give refill of med and follow up in 6 wks.

## 2015-03-05 NOTE — Progress Notes (Signed)
   Subjective:    Patient ID: Yvette Zuniga, female    DOB: 12-29-1985, 29 y.o.   MRN: [id number]  HPI   Pt in today for follow up. She weighed herself at home and was 218. Our check today was 222. Last weight was 227. Pt is exercising daily. Pt doing 20 minutes 2-3 times a week. Some interval training. Pt is taking in about 1500 calories a day. No side effects from med reported.  Pt is on nuvaring.(using this properly)     Review of Systems  Constitutional: Negative for fever, chills and fatigue.  Respiratory: Negative for cough, chest tightness and wheezing.   Cardiovascular: Negative for chest pain and palpitations.  Gastrointestinal: Negative for abdominal pain.  Musculoskeletal: Negative for back pain.  Neurological: Negative for dizziness, seizures, weakness and light-headedness.  Hematological: Negative for adenopathy. Does not bruise/bleed easily.  Psychiatric/Behavioral: Negative for behavioral problems and confusion.   Past Medical History  Diagnosis Date  . High platelet count   . CTS (carpal tunnel syndrome) 08/30/2014    Social History   Social History  . Marital Status: Single    Spouse Name: N/A  . Number of Children: N/A  . Years of Education: N/A   Occupational History  . Not on file.   Social History Main Topics  . Smoking status: Never Smoker   . Smokeless tobacco: Not on file  . Alcohol Use: Yes     Comment: 1 bottle of wine twice a month.  . Drug Use: No  . Sexual Activity: Yes   Other Topics Concern  . Not on file   Social History Narrative    Past Surgical History  Procedure Laterality Date  . Wisdom tooth extraction      Family History  Problem Relation Age of Onset  . Hypertension Mother   . Diabetes Mother   . Stroke Mother   . Glaucoma Father     Allergies  Allergen Reactions  . Penicillins     Current Outpatient Prescriptions on File Prior to Visit  Medication Sig Dispense Refill  . etonogestrel-ethinyl estradiol  (NUVARING) 0.12-0.015 MG/24HR vaginal ring Insert vaginally and leave in place for 3 consecutive weeks, then remove for 1 week. 1 each 12  . Lorcaserin HCl 10 MG TABS 1 tab po bid 60 tablet 0   No current facility-administered medications on file prior to visit.    BP 124/82 mmHg  Pulse 88  Temp(Src) 97.5 F (36.4 C) (Oral)  Ht 5' 4.25" (1.632 m)  Wt 222 lb 12.8 oz (101.061 kg)  BMI 37.94 kg/m2  SpO2 97%  LMP 02/11/2015       Objective:   Physical Exam  General- No acute distress. Pleasant patient. Neck- Full range of motion, no jvd Lungs- Clear, even and unlabored. Heart- regular rate and rhythm. Neurologic- CNII- XII grossly intact.  Abdomen Inspection:-Inspection Normal.  Palpation/Perucssion: Palpation and Percussion of the abdomen reveal- Non Tender, No Rebound tenderness, No rigidity(Guarding) and No Palpable abdominal masses.  Liver:-Normal.  Spleen:- Normal.   Back- cva tenderness.        Assessment & Plan:  Pt has made good progress. Weight loss of at least 5 lbs. By scale at home 9 lbs. Will still shoot for at least 5% loss by 12 weeks use of lorcoserin.  Will give refill of med and follow up in 6 wks.

## 2015-03-05 NOTE — Progress Notes (Signed)
Pre visit review using our clinic review tool, if applicable. No additional management support is needed unless otherwise documented below in the visit note. 

## 2015-03-18 ENCOUNTER — Ambulatory Visit: Payer: 59 | Admitting: Medical

## 2015-04-16 ENCOUNTER — Ambulatory Visit: Payer: 59 | Admitting: Medical

## 2015-05-16 ENCOUNTER — Encounter: Payer: Self-pay | Admitting: Medical

## 2015-05-16 ENCOUNTER — Ambulatory Visit (INDEPENDENT_AMBULATORY_CARE_PROVIDER_SITE_OTHER): Payer: 59 | Admitting: Medical

## 2015-05-16 VITALS — BP 120/80 | HR 78 | Temp 98.0°F | Ht 64.25 in | Wt 229.0 lb

## 2015-05-16 DIAGNOSIS — E669 Obesity, unspecified: Secondary | ICD-10-CM | POA: Diagnosis not present

## 2015-05-16 MED ORDER — LORCASERIN HCL 10 MG PO TABS: ORAL_TABLET | ORAL | Status: DC

## 2015-05-16 NOTE — Progress Notes (Signed)
   Subjective:    Patient ID: Yvette Zuniga, female    DOB: 1986/01/10, 29 y.o.   MRN: [id number]  HPI   Pt has not been taking medication for past month and half. Then she had weight gain. Pt has very limited number of locaserin left presently. She was traveling a lot for work. No side effects from med reported. Before she stopped it she had 7 lb weight loss.  Pt wants to restart locaserin.  LMP- May 05, 2015. Pt is on nuvaring.   Review of Systems  Constitutional: Negative for chills and fatigue.  Respiratory: Negative for cough, chest tightness, shortness of breath and wheezing.   Cardiovascular: Negative for chest pain and palpitations.  Gastrointestinal: Negative for abdominal pain.  Neurological: Negative for dizziness and headaches.    Past Medical History  Diagnosis Date  . High platelet count ([place])   . CTS (carpal tunnel syndrome) 08/30/2014    Social History   Social History  . Marital Status: Single    Spouse Name: N/A  . Number of Children: N/A  . Years of Education: N/A   Occupational History  . Not on file.   Social History Main Topics  . Smoking status: Never Smoker   . Smokeless tobacco: Not on file  . Alcohol Use: Yes     Comment: 1 bottle of wine twice a month.  . Drug Use: No  . Sexual Activity: Yes   Other Topics Concern  . Not on file   Social History Narrative    Past Surgical History  Procedure Laterality Date  . Wisdom tooth extraction      Family History  Problem Relation Age of Onset  . Hypertension Mother   . Diabetes Mother   . Stroke Mother   . Glaucoma Father     Allergies  Allergen Reactions  . Penicillins     Current Outpatient Prescriptions on File Prior to Visit  Medication Sig Dispense Refill  . etonogestrel-ethinyl estradiol (NUVARING) 0.12-0.015 MG/24HR vaginal ring Insert vaginally and leave in place for 3 consecutive weeks, then remove for 1 week. 1 each 12   No current facility-administered  medications on file prior to visit.    BP 120/80 mmHg  Pulse 78  Temp(Src) 98 F (36.7 C) (Oral)  Ht 5' 4.25" (1.632 m)  Wt 229 lb (103.874 kg)  BMI 39.00 kg/m2  SpO2 98%  LMP 05/05/2015      Objective:   Physical Exam  General- No acute distress. Pleasant patient. Neck- Full range of motion, no jvd Lungs- Clear, even and unlabored. Heart- regular rate and rhythm. Neurologic- CNII- XII grossly intact.       Assessment & Plan:  For your obesity will rx locaserin for another month.  Continue diet and exercise.  Follow up in 4-6 wks for weight check. Will consider other med options if unsuccesful or if you loose weight but then regain

## 2015-05-16 NOTE — Patient Instructions (Signed)
   For your obesity will rx locaserin for another month.  Continue diet and exercise.  Follow up in 4-6 wks for weight check. Will consider other med options if unsuccesful or if you loose weight but then regain.

## 2015-05-16 NOTE — Progress Notes (Signed)
Pre visit review using our clinic review tool, if applicable. No additional management support is needed unless otherwise documented below in the visit note. 

## 2015-06-16 NOTE — L&D Delivery Note (Addendum)
Delivery Note At 7:25 PM, on Mar 31, 2016, a viable female "[name]" was delivered via Vaginal, Spontaneous Delivery (Presentation:Middle Occiput Posterior with compound Left Hand and restitution to LOT ). After delivery of head, hand noted near cheek and extended allowing for shoulders to be easily delivered. Infant with great tone, color, and spontaneous cry. Tactile stimulation given by provider and infant placed on mother's abdomen where nurse continued tactile stimulation. APGAR: 9, 10;. Cord clamped, cut, and blood collected. Placenta delivered spontaneously and noted to be intact with 3VC upon inspection. Vaginal inspection revealed no lacerations.  Fundus firm, at the umbilicus, and bleeding small.  Mother hemodynamically stable and infant skin to skin prior to provider exit.  Mother unsure of birth control method and opts to breastfeed.  Family wishes for infant to be circumcised during inpatient stay.  Infant weight at one hour of life: 7lbs 13.2oz, 20in       Anesthesia:   Episiotomy: None Lacerations: None Suture Repair: None Est. Blood Loss (mL): 143mL  Mom to postpartum.  Baby to Couplet care / Skin to Skin.  Maryann Conners MSN, CNM 05/01/2016, 7:53 PM

## 2015-09-06 ENCOUNTER — Other Ambulatory Visit (HOSPITAL_COMMUNITY): Payer: Self-pay | Admitting: Obstetrics and Gynecology

## 2015-09-06 ENCOUNTER — Ambulatory Visit (HOSPITAL_COMMUNITY)
Admission: RE | Admit: 2015-09-06 | Discharge: 2015-09-06 | Disposition: A | Discharge status: Home / Self Care | Payer: 59 | Source: Ambulatory Visit | Attending: Obstetrics and Gynecology | Admitting: Obstetrics and Gynecology

## 2015-09-06 DIAGNOSIS — Z3A01 Less than 8 weeks gestation of pregnancy: Secondary | ICD-10-CM | POA: Insufficient documentation

## 2015-09-06 DIAGNOSIS — O283 Abnormal ultrasonic finding on antenatal screening of mother: Secondary | ICD-10-CM | POA: Insufficient documentation

## 2015-09-06 DIAGNOSIS — O3680X0 Pregnancy with inconclusive fetal viability, not applicable or unspecified: Secondary | ICD-10-CM

## 2015-10-02 LAB — OB RESULTS CONSOLE ABO/RH: RH TYPE: POSITIVE

## 2015-10-02 LAB — OB RESULTS CONSOLE HEPATITIS B SURFACE ANTIGEN: Hepatitis B Surface Ag: NEGATIVE

## 2015-10-02 LAB — OB RESULTS CONSOLE HIV ANTIBODY (ROUTINE TESTING): HIV: NONREACTIVE

## 2015-10-02 LAB — OB RESULTS CONSOLE GC/CHLAMYDIA
Chlamydia: NEGATIVE
GC PROBE AMP, GENITAL: NEGATIVE

## 2015-10-02 LAB — OB RESULTS CONSOLE RPR: RPR: NONREACTIVE

## 2015-10-02 LAB — OB RESULTS CONSOLE RUBELLA ANTIBODY, IGM: RUBELLA: IMMUNE

## 2016-04-09 LAB — OB RESULTS CONSOLE GBS: STREP GROUP B AG: NEGATIVE

## 2016-05-01 ENCOUNTER — Encounter (HOSPITAL_COMMUNITY): Payer: Self-pay

## 2016-05-01 ENCOUNTER — Inpatient Hospital Stay (HOSPITAL_COMMUNITY): Payer: 59 | Admitting: Anesthesiology

## 2016-05-01 ENCOUNTER — Inpatient Hospital Stay (HOSPITAL_COMMUNITY)
Admission: AD | Admit: 2016-05-01 | Discharge: 2016-05-03 | DRG: 775 | Disposition: A | Discharge status: Home / Self Care | Payer: 59 | Source: Ambulatory Visit | Attending: Obstetrics and Gynecology | Admitting: Obstetrics and Gynecology

## 2016-05-01 DIAGNOSIS — Z823 Family history of stroke: Secondary | ICD-10-CM

## 2016-05-01 DIAGNOSIS — Z8249 Family history of ischemic heart disease and other diseases of the circulatory system: Secondary | ICD-10-CM | POA: Diagnosis not present

## 2016-05-01 DIAGNOSIS — Z833 Family history of diabetes mellitus: Secondary | ICD-10-CM | POA: Diagnosis not present

## 2016-05-01 DIAGNOSIS — O3110X0 Continuing pregnancy after spontaneous abortion of one fetus or more, unspecified trimester, not applicable or unspecified: Secondary | ICD-10-CM

## 2016-05-01 DIAGNOSIS — Z3403 Encounter for supervision of normal first pregnancy, third trimester: Secondary | ICD-10-CM | POA: Diagnosis present

## 2016-05-01 DIAGNOSIS — Z3A39 39 weeks gestation of pregnancy: Secondary | ICD-10-CM

## 2016-05-01 LAB — RPR: RPR Ser Ql: NONREACTIVE

## 2016-05-01 LAB — CBC
HEMATOCRIT: 33.9 % — AB (ref 36.0–46.0)
Hemoglobin: 11.6 g/dL — ABNORMAL LOW (ref 12.0–15.0)
MCH: 29.8 pg (ref 26.0–34.0)
MCHC: 34.2 g/dL (ref 30.0–36.0)
MCV: 87.1 fL (ref 78.0–100.0)
Platelets: 355 10*3/uL (ref 150–400)
RBC: 3.89 MIL/uL (ref 3.87–5.11)
RDW: 14 % (ref 11.5–15.5)
WBC: 13.4 10*3/uL — AB (ref 4.0–10.5)

## 2016-05-01 LAB — TYPE AND SCREEN
ABO/RH(D): B POS
ANTIBODY SCREEN: NEGATIVE

## 2016-05-01 LAB — ABO/RH: ABO/RH(D): B POS

## 2016-05-01 MED ORDER — ONDANSETRON HCL 4 MG/2ML IJ SOLN: 4.0000 mg | INTRAMUSCULAR | Status: DC | PRN

## 2016-05-01 MED ORDER — ACETAMINOPHEN 325 MG PO TABS
650.0000 mg | ORAL_TABLET | ORAL | Status: DC | PRN
  Administered 2016-05-02: 650 mg via ORAL
  Filled 2016-05-01: qty 2

## 2016-05-01 MED ORDER — FLEET ENEMA 7-19 GM/118ML RE ENEM: 1.0000 | ENEMA | RECTAL | Status: DC | PRN

## 2016-05-01 MED ORDER — BENZOCAINE-MENTHOL 20-0.5 % EX AERO
1.0000 "application " | INHALATION_SPRAY | CUTANEOUS | Status: DC | PRN
  Filled 2016-05-01: qty 56

## 2016-05-01 MED ORDER — PHENYLEPHRINE 40 MCG/ML (10ML) SYRINGE FOR IV PUSH (FOR BLOOD PRESSURE SUPPORT)
80.0000 ug | PREFILLED_SYRINGE | INTRAVENOUS | Status: DC | PRN
  Filled 2016-05-01: qty 5

## 2016-05-01 MED ORDER — OXYTOCIN 40 UNITS IN LACTATED RINGERS INFUSION - SIMPLE MED
1.0000 m[IU]/min | INTRAVENOUS | Status: DC
  Administered 2016-05-01: 2 m[IU]/min via INTRAVENOUS

## 2016-05-01 MED ORDER — TETANUS-DIPHTH-ACELL PERTUSSIS 5-2.5-18.5 LF-MCG/0.5 IM SUSP: 0.5000 mL | Freq: Once | INTRAMUSCULAR | Status: DC

## 2016-05-01 MED ORDER — EPHEDRINE 5 MG/ML INJ
10.0000 mg | INTRAVENOUS | Status: DC | PRN
  Filled 2016-05-01: qty 4

## 2016-05-01 MED ORDER — DIBUCAINE 1 % RE OINT: 1.0000 "application " | TOPICAL_OINTMENT | RECTAL | Status: DC | PRN

## 2016-05-01 MED ORDER — SENNOSIDES-DOCUSATE SODIUM 8.6-50 MG PO TABS
2.0000 | ORAL_TABLET | ORAL | Status: DC
  Administered 2016-05-01: 2 via ORAL
  Filled 2016-05-01: qty 2

## 2016-05-01 MED ORDER — SOD CITRATE-CITRIC ACID 500-334 MG/5ML PO SOLN: 30.0000 mL | ORAL | Status: DC | PRN

## 2016-05-01 MED ORDER — LACTATED RINGERS IV SOLN
500.0000 mL | Freq: Once | INTRAVENOUS | Status: AC
  Administered 2016-05-01: 500 mL via INTRAVENOUS

## 2016-05-01 MED ORDER — OXYTOCIN BOLUS FROM INFUSION
500.0000 mL | Freq: Once | INTRAVENOUS | Status: AC
  Administered 2016-05-01: 500 mL via INTRAVENOUS

## 2016-05-01 MED ORDER — PHENYLEPHRINE 40 MCG/ML (10ML) SYRINGE FOR IV PUSH (FOR BLOOD PRESSURE SUPPORT)
PREFILLED_SYRINGE | INTRAVENOUS | Status: AC
  Filled 2016-05-01: qty 20

## 2016-05-01 MED ORDER — LACTATED RINGERS IV SOLN
INTRAVENOUS | Status: DC
  Administered 2016-05-01 (×2): via INTRAVENOUS

## 2016-05-01 MED ORDER — WITCH HAZEL-GLYCERIN EX PADS: 1.0000 "application " | MEDICATED_PAD | CUTANEOUS | Status: DC | PRN

## 2016-05-01 MED ORDER — ACETAMINOPHEN 325 MG PO TABS: 650.0000 mg | ORAL_TABLET | ORAL | Status: DC | PRN

## 2016-05-01 MED ORDER — DIPHENHYDRAMINE HCL 25 MG PO CAPS: 25.0000 mg | ORAL_CAPSULE | Freq: Four times a day (QID) | ORAL | Status: DC | PRN

## 2016-05-01 MED ORDER — LIDOCAINE HCL (PF) 1 % IJ SOLN
30.0000 mL | INTRAMUSCULAR | Status: DC | PRN
  Filled 2016-05-01: qty 30

## 2016-05-01 MED ORDER — IBUPROFEN 600 MG PO TABS
600.0000 mg | ORAL_TABLET | Freq: Four times a day (QID) | ORAL | Status: DC
  Administered 2016-05-01 – 2016-05-03 (×7): 600 mg via ORAL
  Filled 2016-05-01 (×7): qty 1

## 2016-05-01 MED ORDER — TERBUTALINE SULFATE 1 MG/ML IJ SOLN
0.2500 mg | Freq: Once | INTRAMUSCULAR | Status: DC | PRN
  Filled 2016-05-01: qty 1

## 2016-05-01 MED ORDER — COCONUT OIL OIL: 1.0000 "application " | TOPICAL_OIL | Status: DC | PRN

## 2016-05-01 MED ORDER — ONDANSETRON HCL 4 MG/2ML IJ SOLN
4.0000 mg | Freq: Four times a day (QID) | INTRAMUSCULAR | Status: DC | PRN
  Administered 2016-05-01: 4 mg via INTRAVENOUS
  Filled 2016-05-01: qty 2

## 2016-05-01 MED ORDER — FENTANYL CITRATE (PF) 100 MCG/2ML IJ SOLN
100.0000 ug | INTRAMUSCULAR | Status: DC | PRN
  Administered 2016-05-01: 100 ug via INTRAVENOUS

## 2016-05-01 MED ORDER — FENTANYL CITRATE (PF) 100 MCG/2ML IJ SOLN
INTRAMUSCULAR | Status: AC
  Filled 2016-05-01: qty 2

## 2016-05-01 MED ORDER — SIMETHICONE 80 MG PO CHEW: 80.0000 mg | CHEWABLE_TABLET | ORAL | Status: DC | PRN

## 2016-05-01 MED ORDER — FENTANYL 2.5 MCG/ML BUPIVACAINE 1/10 % EPIDURAL INFUSION (WH - ANES)
INTRAMUSCULAR | Status: AC
  Filled 2016-05-01: qty 100

## 2016-05-01 MED ORDER — FENTANYL 2.5 MCG/ML BUPIVACAINE 1/10 % EPIDURAL INFUSION (WH - ANES)
14.0000 mL/h | INTRAMUSCULAR | Status: DC | PRN
  Administered 2016-05-01 (×3): 14 mL/h via EPIDURAL
  Filled 2016-05-01 (×2): qty 100

## 2016-05-01 MED ORDER — DIPHENHYDRAMINE HCL 50 MG/ML IJ SOLN: 12.5000 mg | INTRAMUSCULAR | Status: DC | PRN

## 2016-05-01 MED ORDER — LIDOCAINE HCL (PF) 1 % IJ SOLN
INTRAMUSCULAR | Status: DC | PRN
  Administered 2016-05-01: 6 mL via EPIDURAL
  Administered 2016-05-01: 4 mL

## 2016-05-01 MED ORDER — ONDANSETRON HCL 4 MG PO TABS: 4.0000 mg | ORAL_TABLET | ORAL | Status: DC | PRN

## 2016-05-01 MED ORDER — OXYCODONE-ACETAMINOPHEN 5-325 MG PO TABS: 1.0000 | ORAL_TABLET | ORAL | Status: DC | PRN

## 2016-05-01 MED ORDER — OXYTOCIN 40 UNITS IN LACTATED RINGERS INFUSION - SIMPLE MED
2.5000 [IU]/h | INTRAVENOUS | Status: DC
  Filled 2016-05-01: qty 1000

## 2016-05-01 MED ORDER — PRENATAL MULTIVITAMIN CH
1.0000 | ORAL_TABLET | Freq: Every day | ORAL | Status: DC
  Administered 2016-05-02: 1 via ORAL
  Filled 2016-05-01 (×2): qty 1

## 2016-05-01 MED ORDER — ZOLPIDEM TARTRATE 5 MG PO TABS
5.0000 mg | ORAL_TABLET | Freq: Every evening | ORAL | Status: DC | PRN
Start: 2016-05-01 — End: 2016-05-03

## 2016-05-01 MED ORDER — OXYCODONE-ACETAMINOPHEN 5-325 MG PO TABS: 2.0000 | ORAL_TABLET | ORAL | Status: DC | PRN

## 2016-05-01 MED ORDER — LACTATED RINGERS IV SOLN
500.0000 mL | INTRAVENOUS | Status: DC | PRN
  Administered 2016-05-01: 500 mL via INTRAVENOUS

## 2016-05-01 NOTE — Progress Notes (Signed)
Lorrain Duru is a 30 y.o. G3P1011 at 66w4dadmitted for active labor  Subjective:  Comfortable with  Epidural Contractions every 1-2 minutes, lasting 60 seconds, Adequate MVU's    Objective: BP 131/72   Pulse 94   Temp 99 F (37.2 C) (Oral)   Resp 16   Ht 5\' 3"  (1.6 m)   Wt 245 lb (111.1 kg)   LMP 05/05/2015   SpO2 98%   BMI 43.40 kg/m  No intake/output data recorded. No intake/output data recorded.  FHT:  FHR: Category 1 bpm, variability:mod moderate,  accelerations:  Present,  decelerations:  Absent  SVE:   Dilation: 9 Effacement (%): 90 Station: 0 Exam by:: Termaine Roupp, cnm/kim fields, rn  Labs: Lab Results  Component Value Date   WBC 13.4 (H) 05/01/2016   HGB 11.6 (L) 05/01/2016   HCT 33.9 (L) 05/01/2016   MCV 87.1 05/01/2016   PLT 355 05/01/2016    Assessment / Plan: Protracted active phase Fetal Wellbeing: reassuring Anticipated MOD:  NSVD  Tiffanyann Deroo A Halsey Hammen 05/01/2016, 5:14 PM

## 2016-05-01 NOTE — Progress Notes (Signed)
Yvette Zuniga is a 30 y.o. G3P1011 at 38w4dadmitted for active labor  Subjective:  Comfortable with  Epidural Contractions every 1-2 minutes, lasting 60 seconds    Objective: BP 127/76   Pulse (!) 105   Temp 98.8 F (37.1 C) (Oral)   Resp 16   Ht 5\' 3"  (1.6 m)   Wt 245 lb (111.1 kg)   LMP 05/05/2015   SpO2 98%   BMI 43.40 kg/m  No intake/output data recorded. Total I/O In: -  Out: 250 [Urine:250]  FHT:  FHR: Category 1 , variability: moderate,  accelerations:  Present SVE:   Dilation: Lip/rim Effacement (%): 90 Station: +1 Exam by:: k fields, rn  Labs: Lab Results  Component Value Date   WBC 13.4 (H) 05/01/2016   HGB 11.6 (L) 05/01/2016   HCT 33.9 (L) 05/01/2016   MCV 87.1 05/01/2016   PLT 355 05/01/2016    Assessment / Plan: Protracted active phase  Recheck cervix at 630 pm and start pushing if complete. Fetal Wellbeing: reassuring Anticipated MOD:  NSVD  Lori A Clemmons 05/01/2016, 6:12 PM

## 2016-05-01 NOTE — Anesthesia Procedure Notes (Signed)
Epidural Patient location during procedure: OB  Preanesthetic Checklist Completed: patient identified, site marked, surgical consent, pre-op evaluation, timeout performed, IV checked, risks and benefits discussed and monitors and equipment checked  Epidural Patient position: sitting Prep: site prepped and draped and DuraPrep Patient monitoring: continuous pulse ox and blood pressure Approach: midline Location: L3-L4 Injection technique: LOR air  Needle:  Needle type: Tuohy  Needle gauge: 17 G Needle length: 9 cm and 9 Needle insertion depth: 6 cm Catheter type: closed end flexible Catheter size: 19 Gauge Catheter at skin depth: 13 cm Test dose: negative  Assessment Events: blood not aspirated, injection not painful, no injection resistance, negative IV test and no paresthesia  Additional Notes Dosing of Epidural:  1st dose, through catheter ............................................Marland Kitchen  Xylocaine 40 mg  2nd dose, through catheter, after waiting 3 minutes........Marland KitchenXylocaine 60 mg    As each dose occurred, patient was free of IV sx; and patient exhibited no evidence of SA injection.  Patient is more comfortable after epidural dosed. Please see RN's note for documentation of vital signs,and FHR which are stable.  Patient reminded not to try to ambulate with numb legs, and that an RN must be present when she attempts to get up.

## 2016-05-01 NOTE — MAU Note (Signed)
Patient presents to mau with c/o contractions that have gotten stronger throughout the day. Denies LOF or VB at this time. +FM. Endorses being 2 cm at ob office check today.

## 2016-05-01 NOTE — Progress Notes (Signed)
LABOR PROGRESS NOTE  Yvette Zuniga is a 30 y.o. G3P1011 at [redacted]w[redacted]d  admitted for labor  Subjective: Comfortable with Epidural but feeling some rectal pressure  Objective: BP 113/63   Pulse (!) 103   Temp 98.7 F (37.1 C) (Oral)   Resp 16   Ht 5\' 3"  (1.6 m)   Wt 245 lb (111.1 kg)   LMP 05/05/2015   SpO2 98%   BMI 43.40 kg/m  or  Vitals:   05/01/16 1401 05/01/16 1431 05/01/16 1459 05/01/16 1535  BP: (!) 106/59 113/63    Pulse: (!) 104 (!) 103    Resp:  16 16 16   Temp:      TempSrc:      SpO2:      Weight:      Height:         Dilation: 8 Effacement (%):  (swollen ant lip) Cervical Position: Middle Station: 0 Presentation: Vertex Exam by:: k fields, rn  Labs: Lab Results  Component Value Date   WBC 13.4 (H) 05/01/2016   HGB 11.6 (L) 05/01/2016   HCT 33.9 (L) 05/01/2016   MCV 87.1 05/01/2016   PLT 355 05/01/2016    Patient Active Problem List   Diagnosis Date Noted  . Indication for care in labor or delivery 05/01/2016  . 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 Low serum vitamin D 08/30/2014    Assessment / Plan: 30 y.o. G3P1011 at [redacted]w[redacted]d here for Labor  Labor: Pitocin  Fetal Wellbeing:  Cat 1 Pain Control:  Epidural Anticipated MOD:  SVD  Yvette Zuniga 05/01/2016, 3:36 PM

## 2016-05-01 NOTE — H&P (Signed)
Yvette Zuniga is a 30 y.o. female, G3P1011 at 39.4 weeks, presenting for contractions.  Pt denies leaking of fluid or bleeding.  Fm+. 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  History of present pregnancy: Patient entered care at 9 weeks.   EDC of 05/04/2016 was established by LMP.   Anatomy scan:  19 weeks, with normal findings and an posterior placenta.   Additional [place] evaluations: Weekly BPP with AFI starting at 32 weeks for increased BMI with last growth on 11/6 60% Significant prenatal events:  Vanishing twin noted first trimester.  Put on Progesterone at 6 weeks till 14 weeks. Last evaluation:  Yesterday  OB History    Gravida Para Term Preterm AB Living   3 1 1  0 1 1   SAB TAB Ectopic Multiple Live Births   1 0 0 0 1     Past Medical History:  Diagnosis Date  . CTS (carpal tunnel syndrome) 08/30/2014  . High platelet count ([place])    Past Surgical History:  Procedure Laterality Date  . WISDOM TOOTH EXTRACTION     Family History: family history includes Diabetes in her mother; Glaucoma in her father; Hypertension in her mother; Stroke in her mother. Social History:  reports that she has never smoked. She has never used smokeless tobacco. She reports that she drinks alcohol. She reports that she does not use drugs.   Prenatal Transfer Tool  Maternal Diabetes: No Genetic Screening: Normal Maternal Ultrasounds/Referrals: Declined Fetal Ultrasounds or other Referrals:  None Maternal Substance Abuse:  No Significant Maternal Medications:  None Significant Maternal Lab Results: None  TDAP No Flu No  ROS:  All 10 systems reviewed and negative except as stated above  Allergies  Allergen Reactions  . Penicillins      Dilation: 4.5 Effacement (%): 80 Station: -2 Exam by:: Erasmo Score RN Blood pressure 121/74, pulse 92, temperature 98.8 F (37.1 C), temperature source Oral, resp. rate 20, height 5\' 3"  (1.6 m), weight 111.1  kg (245 lb), last menstrual period 05/05/2015, SpO2 99 %.  Chest clear Heart RRR without murmur Abd gravid, NT, FH appropriate for gestational age Pelvic: Per RN Ext: Negative edema, +2/+2  FHR: Category 1 UCs: 4 in 10 minutes   Prenatal labs: ABO, Rh: --/--/B POS (11/17 0320) Antibody: NEG (11/17 0320) Rubella:  Immune RPR:   NR HBsAg:   Neg HIV:   NR GBS:  Neg Sickle cell/Hgb electrophoresis:  NR Pap:  2015 neg GC:  Neg Chlamydia:  Neg Genetic screenings:  Neg Glucola:  132 Other:   Hgb 12.6 at NOB, 10.7 at 28 weeks       Assessment/Plan: IUP at 39.4 in active labor Cat 1 strip   Plan: Admit to [facility] per consult with [place] Plans on [facility]B Routine [facility] orders Pain med/epidural prn   Pleas Koch ProtheroCNM, MSN 05/01/2016, 4:12 AM

## 2016-05-01 NOTE — Anesthesia Pain Management Evaluation Note (Signed)
  CRNA Pain Management Visit Note  Patient: Yvette Zuniga, 30 y.o., female  "Hello I am a member of the anesthesia team at [facility]. We have an anesthesia team available at all times to provide care throughout the hospital, including epidural management and anesthesia for C-section. I don't know your plan for the delivery whether it a natural birth, water birth, IV sedation, nitrous supplementation, doula or epidural, but we want to meet your pain goals."   1.Was your pain managed to your expectations on prior hospitalizations?   Yes   2.What is your expectation for pain management during this hospitalization?     Epidural  3.How can we help you reach that goal? Told RN patient requesting epidural now.  Record the patient's initial score and the patient's pain goal.   Pain: 8  Pain Goal: 5 [facility] wants you to be able to say your pain was always managed very well.  Edmon Magid 05/01/2016

## 2016-05-01 NOTE — Anesthesia Preprocedure Evaluation (Signed)

## 2016-05-02 LAB — CBC
HEMATOCRIT: 29.8 % — AB (ref 36.0–46.0)
HEMOGLOBIN: 10.4 g/dL — AB (ref 12.0–15.0)
MCH: 31 pg (ref 26.0–34.0)
MCHC: 34.9 g/dL (ref 30.0–36.0)
MCV: 89 fL (ref 78.0–100.0)
Platelets: 300 10*3/uL (ref 150–400)
RBC: 3.35 MIL/uL — AB (ref 3.87–5.11)
RDW: 14 % (ref 11.5–15.5)
WBC: 20.5 10*3/uL — ABNORMAL HIGH (ref 4.0–10.5)

## 2016-05-02 NOTE — Plan of Care (Signed)
Problem: Life Cycle: Goal: Risk for postpartum hemorrhage will decrease Outcome: Completed/Met Date Met: 05/02/16 Patient vitals and assessments are WDL. Scant lochia with no clots reported.  Goal: Chance of risk for complications during the postpartum period will decrease Outcome: Completed/Met Date Met: 05/02/16 Pt VSS and assessments WDL. Lochia firm, no clots, scant bleeding.   Problem: Role Relationship: Goal: Ability to demonstrate positive interaction with newborn will improve Outcome: Completed/Met Date Met: 05/02/16 Pt shows positive interaction with newborn. FOB also supportive and shows positive interaction with newborn.  Problem: Bowel/Gastric: Goal: Gastrointestinal status will improve Outcome: Completed/Met Date Met: 05/02/16 Pt has had bowel movement, is passing gas, and is refusing tonight's dose of senna.

## 2016-05-02 NOTE — Progress Notes (Signed)
Reviewed CNM note. In to discuss circumcision. Will defer this am.  Baby is getting fetal echo due to murmer.  Has not voided either. Reviewed risks of circumcision.  All questions answered. Mom doing well. Will schedule circumcision for tomorrow morning if ok with pediatrician.

## 2016-05-02 NOTE — Lactation Note (Signed)
This note was copied from a baby's chart. Lactation Consultation Note  Patient Name: Yvette Zuniga [id number] Date: 05/02/2016 Reason for consult: Initial assessment Mom pumped and bottle fed her 1st baby and plans the same with this baby. Mom understands to pump every 3 hours for 15 minute to encourage milk production and protect milk supply. Encouraged hand expression before and after pumping.  Mom reports receiving small amount of colostrum with pumping. Denies discomfort or concerns with pumping. Mom has Spectra 2 DEBP for home use. Breast milk storage guidelines reviewed, refer to Baby N Me booklet, page 25. Lactation brochure left for review, advised of OP services and support group. Encouraged to call for questions/concerns.   Maternal Data Has patient been taught Hand Expression?: Yes Does the patient have breastfeeding experience prior to this delivery?: Yes  Feeding Feeding Type: Bottle Fed - Formula  LATCH Score/Interventions                      Lactation Tools Discussed/Used Tools: Pump Breast pump type: Double-Electric Breast Pump WIC Program: No   Consult Status Consult Status: PRN    Katrine Coho 05/02/2016, 4:26 PM

## 2016-05-02 NOTE — Progress Notes (Signed)
Post Partum Day 1 Subjective:  Well. Lochia are normal. Voiding, ambulating, tolerating normal diet. nursing going well.  Objective: Blood pressure (!) 95/55, pulse 97, temperature 99.1 F (37.3 C), temperature source Oral, resp. rate 18, height 5\' 3"  (1.6 m), weight 245 lb (111.1 kg), last menstrual period 05/05/2015, SpO2 99 %, unknown if currently breastfeeding.  Physical Exam:  General: normal Lochia: appropriate Uterine Fundus: @ U firm non-tender  Extremities: No evidence of DVT seen on physical exam. Edema 1+ feet     Recent Labs  05/01/16 0320 05/02/16 0544  HGB 11.6* 10.4*  HCT 33.9* 29.8*    Assessment/Plan: Normal Post-partum.  Continue routine post-partum care. Inpatient Circ today Anticipate discharge tomorrow    LOS: 1 day   Bertram Gala Yvette Zuniga CNM 05/02/2016, 10:15 AM

## 2016-05-02 NOTE — Anesthesia Postprocedure Evaluation (Signed)
Anesthesia Post Note  Patient: Yvette Zuniga  Procedure(s) Performed: * No procedures listed *  Patient location during evaluation: Mother Baby Anesthesia Type: Epidural Level of consciousness: awake and alert Pain management: satisfactory to patient Vital Signs Assessment: post-procedure vital signs reviewed and stable Respiratory status: respiratory function stable Cardiovascular status: stable Postop Assessment: no headache, no backache, epidural receding, patient able to bend at knees, no signs of nausea or vomiting and adequate PO intake Anesthetic complications: no     Last Vitals:  Vitals:   05/02/16 0245 05/02/16 0553  BP: (!) 89/58 (!) 95/55  Pulse: (!) 102 97  Resp: 18   Temp: 37.3 C     Last Pain:  Vitals:   05/02/16 0545  TempSrc:   PainSc: 0-No pain   Pain Goal: Patients Stated Pain Goal: 2 (05/01/16 0140)               Katherina Mires

## 2016-05-03 MED ORDER — IBUPROFEN 600 MG PO TABS: 600.0000 mg | ORAL_TABLET | Freq: Four times a day (QID) | ORAL | 0 refills | Status: AC

## 2016-05-03 NOTE — Discharge Instructions (Signed)

## 2016-05-03 NOTE — Lactation Note (Signed)
This note was copied from a baby's chart. Lactation Consultation Note  Mother is pumping and supplementing w/ formula. Discussed the importance of establishing her milk supply and pumping q3 hr. Encouraged her to give breastmilk before offering formula. Reviewed supply and demand. Mom encouraged to feed baby 8-12 times/24 hours and with feeding cues.  Reviewed engorgement care and monitoring voids/stools.   Patient Name: Yvette Zuniga [id number] Date: 05/03/2016     Maternal Data    Feeding Feeding Type: Bottle Fed - Formula  LATCH Score/Interventions                      Lactation Tools Discussed/Used Tools: Pump Breast pump type: Double-Electric Breast Pump   Consult Status      Vivianne Master Boschen 05/03/2016, 9:44 AM

## 2016-05-03 NOTE — Discharge Summary (Signed)
Obstetric Discharge Summary  Reason for Admission: onset of labor on 05/01/16 Prenatal Procedures: none Intrapartum Procedures: spontaneous vaginal delivery by Yvette Zuniga CNM on 05/01/16 Postpartum Procedures: none Complications-Operative and Postpartum: none  Hemoglobin  Date Value Ref Range Status  05/02/2016 10.4 (L) 12.0 - 15.0 g/dL Final   HCT  Date Value Ref Range Status  05/02/2016 29.8 (L) 36.0 - 46.0 % Final  Delivery Note At 7:25 PM, on Mar 31, 2016, a viable female "Yvette Zuniga" was delivered via Vaginal, Spontaneous Delivery (Presentation:Middle Occiput Posterior with compound Left Hand and restitution to LOT ). After delivery of head, hand noted near cheek and extended allowing for shoulders to be easily delivered. Infant with great tone, color, and spontaneous cry. Tactile stimulation given by provider and infant placed on mother's abdomen where nurse continued tactile stimulation. APGAR: 9, 10;. Cord clamped, cut, and blood collected. Placenta delivered spontaneously and noted to be intact with 3VC upon inspection. Vaginal inspection revealed no lacerations.  Fundus firm, at the umbilicus, and bleeding small.  Mother hemodynamically stable and infant skin to skin prior to provider exit.  Mother unsure of birth control method and opts to breastfeed.  Family wishes for infant to be circumcised during inpatient stay.  Infant weight at one hour of life: 7lbs 13.2oz, 20in       Anesthesia:   Episiotomy: None Lacerations: None Suture Repair: None Est. Blood Loss (mL): 19mL  Mom to postpartum.  Baby to Couplet care / Skin to Skin.   Discharge Diagnoses: Term Pregnancy-delivered  Physical exam:   General: normal Lochia: appropriate Uterine Fundus: @ U firm non-tender  Extremities: No evidence of DVT seen on physical exam. Edema +1 feet   Hospital course: Uncomplicated howerever infant has been diagnosed with heart murmer after having echogram. Will need outpatient  circumcision per Dr. Margie Billet.  Date: 05/03/2016 Activity: unrestricted Diet: routine Medications: Ibuprofen Condition: stable  Breastfeeding:   Yes.   Contraception:  undecided  Instructions: refer to practice specific booklet Discharge to: home [facility] Follow up in 6 week(s).   Specialty:  Obstetrics and Gynecology Why:  Call this week to schedule outpatient circumcision Contact information: [address] [place]  [phone]          Newborn Data:   Baby female Name: Yvette Zuniga CNM 05/03/2016, 10:46 AM

## 2016-11-30 IMAGING — US US OB TRANSVAGINAL
1 series · 15 of 28 positions shown · non-contrast
Comparison: None.

CLINICAL DATA: First trimester pregnancy with inconclusive fetal
viability. Gestational age by LMP of 5 weeks 4 days.

EXAM:
OBSTETRIC <14 WK US AND TRANSVAGINAL OB US
TECHNIQUE: Both transabdominal and transvaginal ultrasound examinations were
performed for complete evaluation of the gestation as well as the
maternal uterus, adnexal regions, and pelvic cul-de-sac.
Transvaginal technique was performed to assess early pregnancy.

[Series 1: us ob transvaginal · 65 acquisitions, 15 frames shown]
[im 1/65]
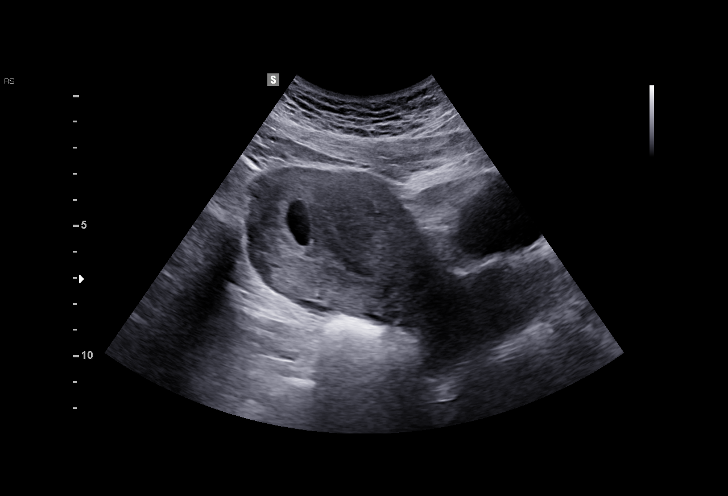
[im 5/65]
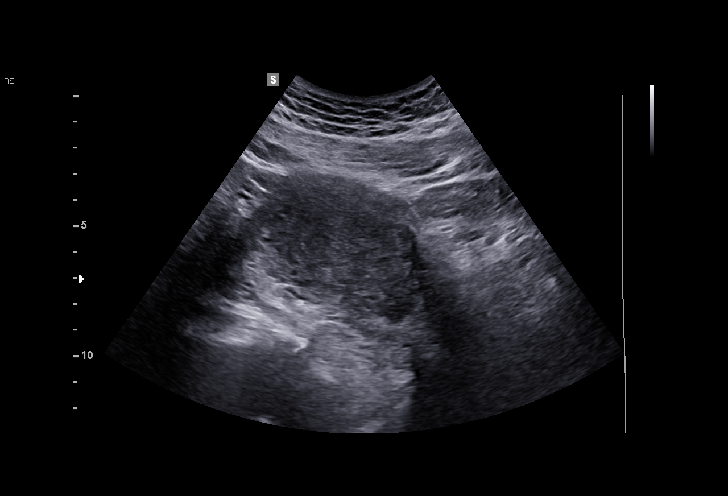
[im 10/65]
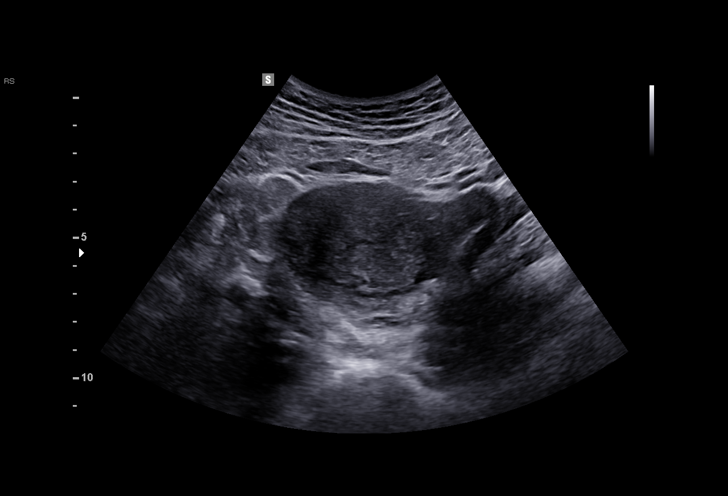
[im 15/65]
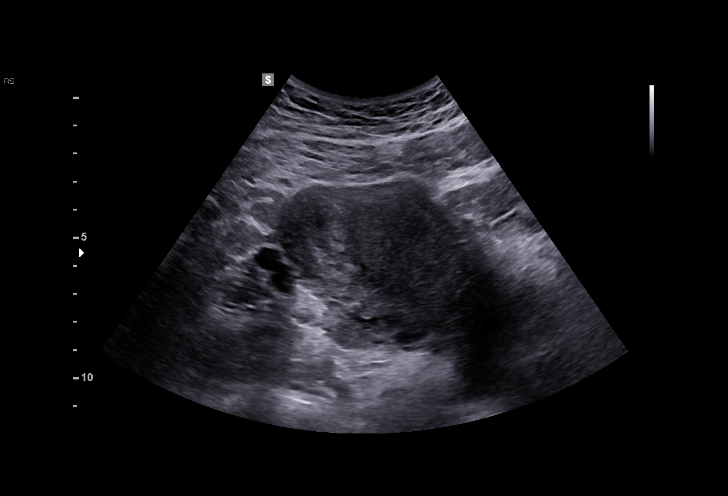
[im 19/65]
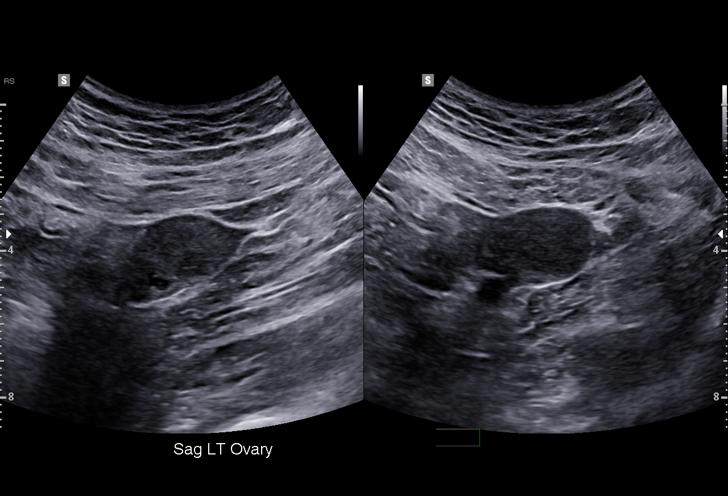
[im 24/65]
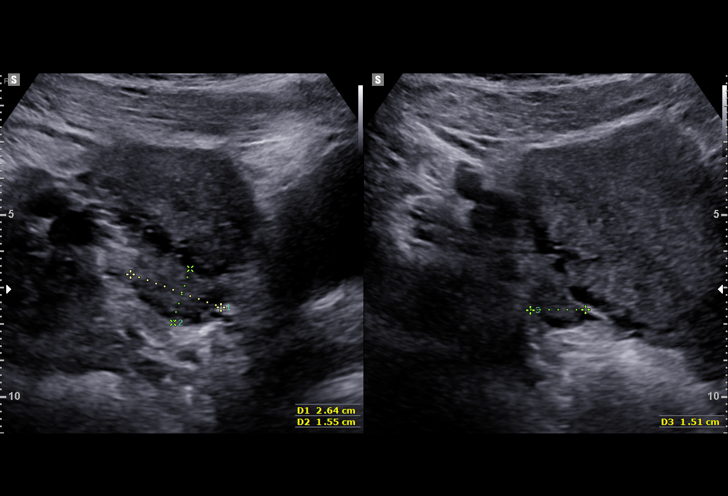
[im 29/65]
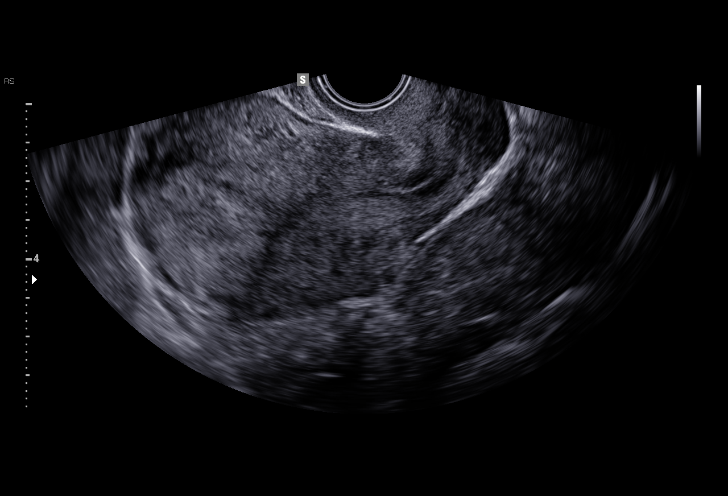
[im 34/65]
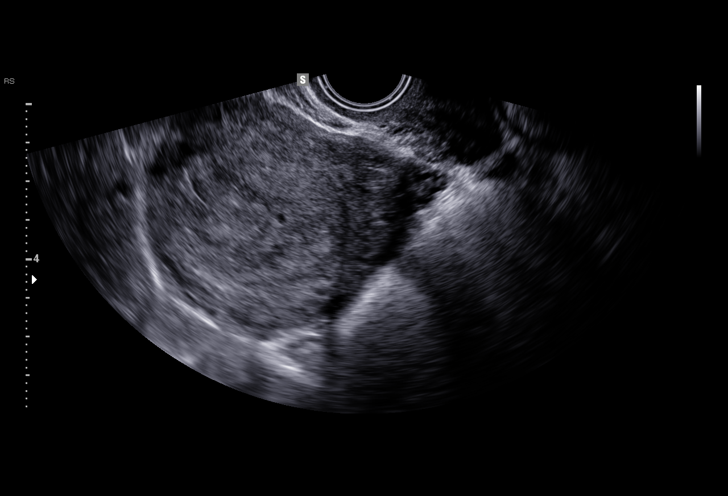
[im 36/65]
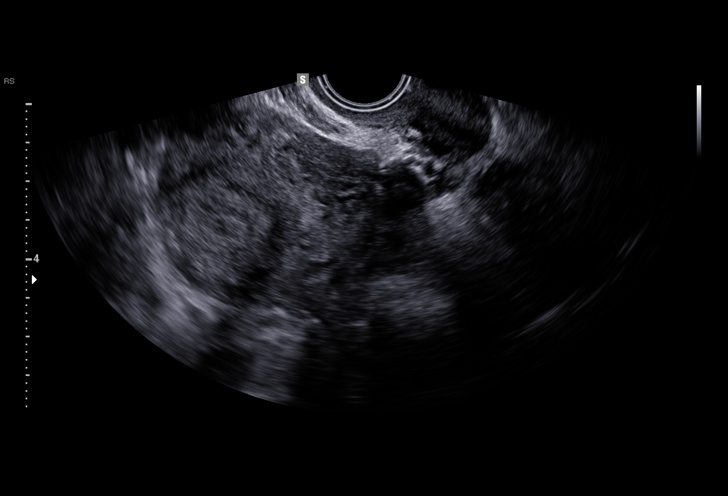
[im 41/65]
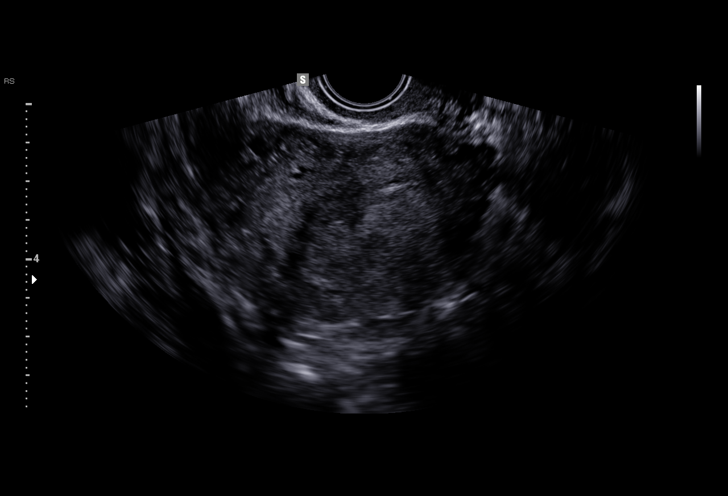
[im 46/65]
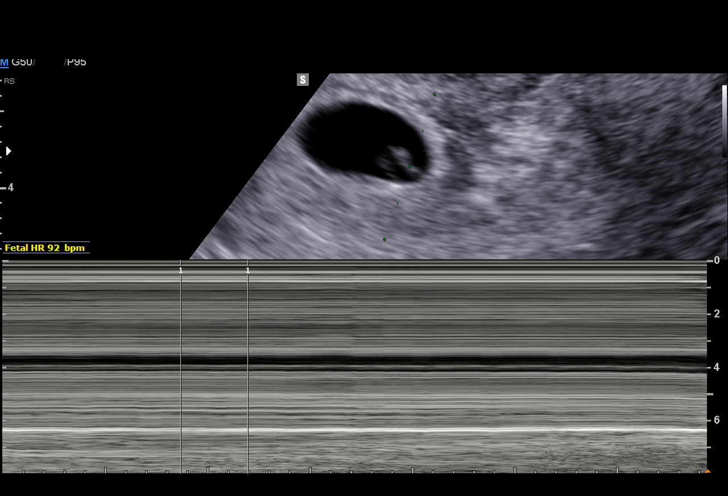
[im 50/65]
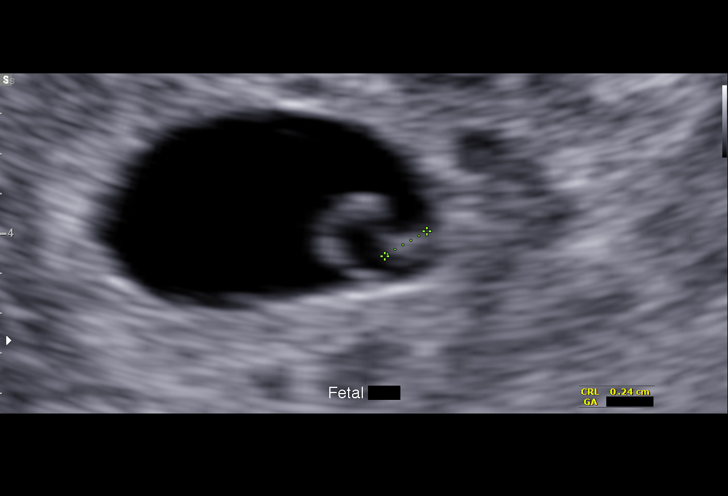
[im 55/65]
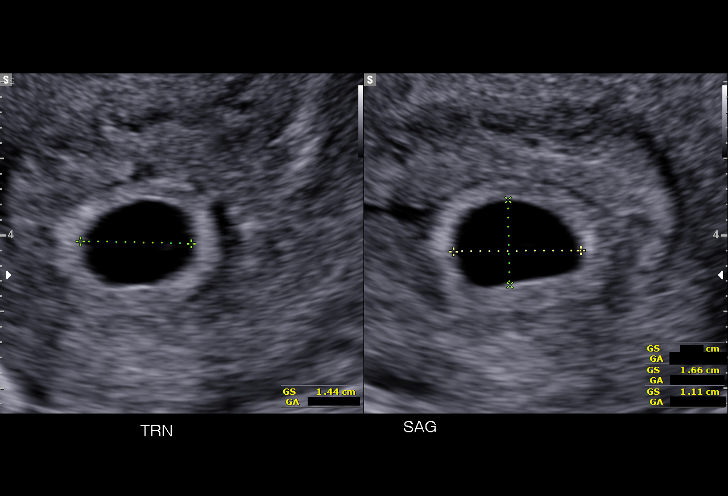
[im 60/65]
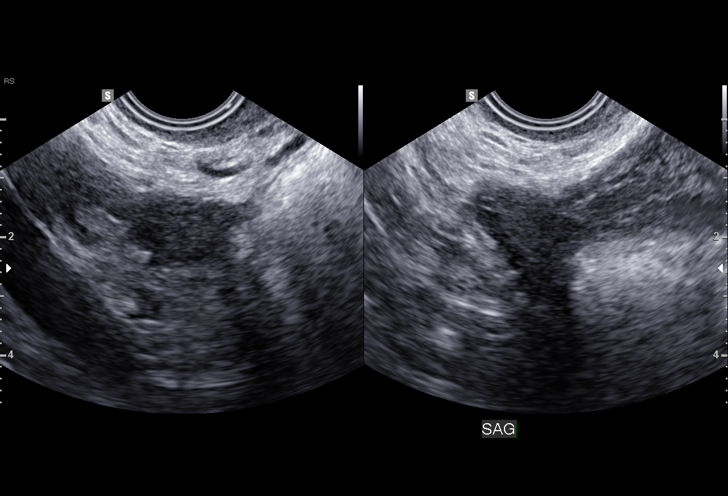
[im 65/65]
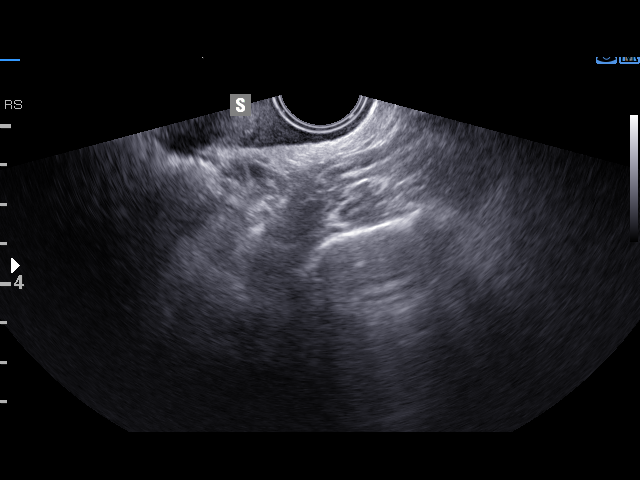

[15 of 28 positions shown; findings below may reference images not displayed]

FINDINGS: Intrauterine gestational sac: Visualized/normal in shape.

Yolk sac:  Visualized

Embryo:  Visualized

Cardiac Activity: Visualized

Heart Rate: 92  bpm

CRL:  2  mm   6 w   0 d                  US EDC: 04/30/2016

Subchorionic hemorrhage:  Small subchorionic hemorrhage noted.

Maternal uterus/adnexae: Both ovaries are normal in appearance. No
mass or free fluid identified.
IMPRESSION: Single living IUP measuring 6 weeks 0 days with US EDC of
04/30/2016. This is concordant with LMP.

## 2018-06-30 DIAGNOSIS — N898 Other specified noninflammatory disorders of vagina: Secondary | ICD-10-CM | POA: Diagnosis not present

## 2018-06-30 DIAGNOSIS — N926 Irregular menstruation, unspecified: Secondary | ICD-10-CM | POA: Diagnosis not present

## 2018-07-08 DIAGNOSIS — Z30430 Encounter for insertion of intrauterine contraceptive device: Secondary | ICD-10-CM | POA: Diagnosis not present

## 2018-07-08 DIAGNOSIS — N926 Irregular menstruation, unspecified: Secondary | ICD-10-CM | POA: Diagnosis not present

## 2018-08-05 DIAGNOSIS — T8332XA Displacement of intrauterine contraceptive device, initial encounter: Secondary | ICD-10-CM | POA: Diagnosis not present

## 2018-08-05 DIAGNOSIS — Z30431 Encounter for routine checking of intrauterine contraceptive device: Secondary | ICD-10-CM | POA: Diagnosis not present

## 2018-08-12 DIAGNOSIS — Z30431 Encounter for routine checking of intrauterine contraceptive device: Secondary | ICD-10-CM | POA: Diagnosis not present

## 2018-11-25 DIAGNOSIS — B373 Candidiasis of vulva and vagina: Secondary | ICD-10-CM | POA: Diagnosis not present

## 2019-01-06 DIAGNOSIS — H40013 Open angle with borderline findings, low risk, bilateral: Secondary | ICD-10-CM | POA: Diagnosis not present

## 2019-05-16 DIAGNOSIS — H40013 Open angle with borderline findings, low risk, bilateral: Secondary | ICD-10-CM | POA: Diagnosis not present

## 2019-05-24 DIAGNOSIS — Z03818 Encounter for observation for suspected exposure to other biological agents ruled out: Secondary | ICD-10-CM | POA: Diagnosis not present

## 2019-07-03 DIAGNOSIS — Z20828 Contact with and (suspected) exposure to other viral communicable diseases: Secondary | ICD-10-CM | POA: Diagnosis not present

## 2019-08-15 DIAGNOSIS — H40013 Open angle with borderline findings, low risk, bilateral: Secondary | ICD-10-CM | POA: Diagnosis not present

## 2019-08-30 DIAGNOSIS — Z20828 Contact with and (suspected) exposure to other viral communicable diseases: Secondary | ICD-10-CM | POA: Diagnosis not present

## 2019-10-06 DIAGNOSIS — Z124 Encounter for screening for malignant neoplasm of cervix: Secondary | ICD-10-CM | POA: Diagnosis not present

## 2019-10-06 DIAGNOSIS — Z6841 Body Mass Index (BMI) 40.0 and over, adult: Secondary | ICD-10-CM | POA: Diagnosis not present

## 2019-10-06 DIAGNOSIS — Z01419 Encounter for gynecological examination (general) (routine) without abnormal findings: Secondary | ICD-10-CM | POA: Diagnosis not present

## 2019-10-09 DIAGNOSIS — Z130 Encounter for screening for diseases of the blood and blood-forming organs and certain disorders involving the immune mechanism: Secondary | ICD-10-CM | POA: Diagnosis not present

## 2019-10-09 DIAGNOSIS — Z0000 Encounter for general adult medical examination without abnormal findings: Secondary | ICD-10-CM | POA: Diagnosis not present

## 2019-10-09 DIAGNOSIS — Z1329 Encounter for screening for other suspected endocrine disorder: Secondary | ICD-10-CM | POA: Diagnosis not present

## 2019-10-09 DIAGNOSIS — Z13220 Encounter for screening for lipoid disorders: Secondary | ICD-10-CM | POA: Diagnosis not present

## 2019-10-26 DIAGNOSIS — Z630 Problems in relationship with spouse or partner: Secondary | ICD-10-CM | POA: Diagnosis not present

## 2019-11-07 DIAGNOSIS — Z630 Problems in relationship with spouse or partner: Secondary | ICD-10-CM | POA: Diagnosis not present

## 2019-11-23 DIAGNOSIS — Z630 Problems in relationship with spouse or partner: Secondary | ICD-10-CM | POA: Diagnosis not present

## 2019-11-29 DIAGNOSIS — Z630 Problems in relationship with spouse or partner: Secondary | ICD-10-CM | POA: Diagnosis not present

## 2019-12-07 DIAGNOSIS — Z630 Problems in relationship with spouse or partner: Secondary | ICD-10-CM | POA: Diagnosis not present

## 2020-01-11 DIAGNOSIS — Z630 Problems in relationship with spouse or partner: Secondary | ICD-10-CM | POA: Diagnosis not present

## 2020-01-25 DIAGNOSIS — Z630 Problems in relationship with spouse or partner: Secondary | ICD-10-CM | POA: Diagnosis not present

## 2020-02-08 DIAGNOSIS — Z630 Problems in relationship with spouse or partner: Secondary | ICD-10-CM | POA: Diagnosis not present

## 2020-02-08 DIAGNOSIS — Z03818 Encounter for observation for suspected exposure to other biological agents ruled out: Secondary | ICD-10-CM | POA: Diagnosis not present

## 2020-03-21 DIAGNOSIS — H40013 Open angle with borderline findings, low risk, bilateral: Secondary | ICD-10-CM | POA: Diagnosis not present

## 2020-03-29 DIAGNOSIS — Z713 Dietary counseling and surveillance: Secondary | ICD-10-CM | POA: Diagnosis not present

## 2020-03-29 DIAGNOSIS — E669 Obesity, unspecified: Secondary | ICD-10-CM | POA: Diagnosis not present

## 2020-06-03 DIAGNOSIS — Z03818 Encounter for observation for suspected exposure to other biological agents ruled out: Secondary | ICD-10-CM | POA: Diagnosis not present
